# Patient Record
Sex: Female | Born: 1975 | Race: White | Hispanic: No | Marital: Married | Smoking: Former smoker
Health system: Southern US, Community
[De-identification: ages and names within clinical notes are randomized; demographics above are authoritative.]

## PROBLEM LIST (undated history)

## (undated) DIAGNOSIS — C801 Malignant (primary) neoplasm, unspecified: Secondary | ICD-10-CM

## (undated) DIAGNOSIS — C50919 Malignant neoplasm of unspecified site of unspecified female breast: Secondary | ICD-10-CM

## (undated) DIAGNOSIS — K589 Irritable bowel syndrome without diarrhea: Secondary | ICD-10-CM

## (undated) DIAGNOSIS — E079 Disorder of thyroid, unspecified: Secondary | ICD-10-CM

## (undated) DIAGNOSIS — F419 Anxiety disorder, unspecified: Secondary | ICD-10-CM

## (undated) HISTORY — PX: BREAST RECONSTRUCTION: SHX9

## (undated) HISTORY — DX: Anxiety disorder, unspecified: F41.9

## (undated) HISTORY — DX: Irritable bowel syndrome, unspecified: K58.9

## (undated) HISTORY — PX: CHOLECYSTECTOMY: SHX55

## (undated) HISTORY — DX: Malignant neoplasm of unspecified site of unspecified female breast: C50.919

## (undated) HISTORY — PX: TONSILLECTOMY: SUR1361

## (undated) HISTORY — DX: Disorder of thyroid, unspecified: E07.9

## (undated) HISTORY — DX: Malignant (primary) neoplasm, unspecified: C80.1

---

## 1998-04-13 ENCOUNTER — Other Ambulatory Visit: Admission: RE | Admit: 1998-04-13 | Discharge: 1998-04-13 | Payer: Self-pay | Admitting: Obstetrics and Gynecology

## 2006-01-09 ENCOUNTER — Other Ambulatory Visit: Admission: RE | Admit: 2006-01-09 | Discharge: 2006-01-09 | Payer: Self-pay | Admitting: Obstetrics and Gynecology

## 2006-04-30 ENCOUNTER — Ambulatory Visit (HOSPITAL_COMMUNITY): Admission: EM | Admit: 2006-04-30 | Discharge: 2006-05-01 | Payer: Self-pay | Admitting: Emergency Medicine

## 2006-04-30 ENCOUNTER — Encounter (INDEPENDENT_AMBULATORY_CARE_PROVIDER_SITE_OTHER): Payer: Self-pay | Admitting: Specialist

## 2006-11-05 ENCOUNTER — Encounter: Admission: RE | Admit: 2006-11-05 | Discharge: 2006-11-05 | Payer: Self-pay | Admitting: Gastroenterology

## 2007-02-28 ENCOUNTER — Encounter: Admission: RE | Admit: 2007-02-28 | Discharge: 2007-02-28 | Payer: Self-pay | Admitting: Obstetrics and Gynecology

## 2007-02-28 ENCOUNTER — Encounter (INDEPENDENT_AMBULATORY_CARE_PROVIDER_SITE_OTHER): Payer: Self-pay | Admitting: Diagnostic Radiology

## 2007-02-28 DIAGNOSIS — C50919 Malignant neoplasm of unspecified site of unspecified female breast: Secondary | ICD-10-CM

## 2007-02-28 HISTORY — DX: Malignant neoplasm of unspecified site of unspecified female breast: C50.919

## 2007-03-06 ENCOUNTER — Ambulatory Visit: Payer: Self-pay | Admitting: Oncology

## 2007-03-12 ENCOUNTER — Encounter: Admission: RE | Admit: 2007-03-12 | Discharge: 2007-03-12 | Payer: Self-pay | Admitting: Obstetrics and Gynecology

## 2007-03-19 LAB — COMPREHENSIVE METABOLIC PANEL
ALT: 12 U/L (ref 0–35)
AST: 13 U/L (ref 0–37)
Albumin: 4.6 g/dL (ref 3.5–5.2)
Alkaline Phosphatase: 58 U/L (ref 39–117)
BUN: 9 mg/dL (ref 6–23)
CO2: 25 mEq/L (ref 19–32)
Calcium: 9.6 mg/dL (ref 8.4–10.5)
Chloride: 105 mEq/L (ref 96–112)
Creatinine, Ser: 0.83 mg/dL (ref 0.40–1.20)
Glucose, Bld: 89 mg/dL (ref 70–99)
Potassium: 4.3 mEq/L (ref 3.5–5.3)
Sodium: 141 mEq/L (ref 135–145)
Total Bilirubin: 0.6 mg/dL (ref 0.3–1.2)
Total Protein: 7.5 g/dL (ref 6.0–8.3)

## 2007-03-19 LAB — CBC WITH DIFFERENTIAL/PLATELET
BASO%: 0.8 % (ref 0.0–2.0)
Basophils Absolute: 0 10*3/uL (ref 0.0–0.1)
EOS%: 1.2 % (ref 0.0–7.0)
Eosinophils Absolute: 0.1 10*3/uL (ref 0.0–0.5)
HCT: 41 % (ref 34.8–46.6)
HGB: 14.4 g/dL (ref 11.6–15.9)
LYMPH%: 29.6 % (ref 14.0–48.0)
MCH: 31.9 pg (ref 26.0–34.0)
MCHC: 35 g/dL (ref 32.0–36.0)
MCV: 91 fL (ref 81.0–101.0)
MONO#: 0.6 10*3/uL (ref 0.1–0.9)
MONO%: 12.8 % (ref 0.0–13.0)
NEUT#: 2.5 10*3/uL (ref 1.5–6.5)
NEUT%: 55.6 % (ref 39.6–76.8)
Platelets: 299 10*3/uL (ref 145–400)
RBC: 4.51 10*6/uL (ref 3.70–5.32)
RDW: 11.9 % (ref 11.3–14.5)
WBC: 4.5 10*3/uL (ref 3.9–10.0)
lymph#: 1.3 10*3/uL (ref 0.9–3.3)

## 2007-03-19 LAB — CANCER ANTIGEN 27.29: CA 27.29: 21 U/mL (ref 0–39)

## 2007-03-19 LAB — LACTATE DEHYDROGENASE: LDH: 138 U/L (ref 94–250)

## 2007-03-28 ENCOUNTER — Encounter (HOSPITAL_COMMUNITY): Admission: RE | Admit: 2007-03-28 | Discharge: 2007-06-09 | Payer: Self-pay | Admitting: Oncology

## 2007-03-31 ENCOUNTER — Ambulatory Visit (HOSPITAL_COMMUNITY): Admission: RE | Admit: 2007-03-31 | Discharge: 2007-03-31 | Payer: Self-pay | Admitting: Oncology

## 2007-04-08 LAB — CBC WITH DIFFERENTIAL/PLATELET
BASO%: 3.3 % — ABNORMAL HIGH (ref 0.0–2.0)
Basophils Absolute: 0 10*3/uL (ref 0.0–0.1)
EOS%: 4.4 % (ref 0.0–7.0)
Eosinophils Absolute: 0.1 10*3/uL (ref 0.0–0.5)
HCT: 37.4 % (ref 34.8–46.6)
HGB: 13.3 g/dL (ref 11.6–15.9)
LYMPH%: 63.6 % — ABNORMAL HIGH (ref 14.0–48.0)
MCH: 31.8 pg (ref 26.0–34.0)
MCHC: 35.6 g/dL (ref 32.0–36.0)
MCV: 89.2 fL (ref 81.0–101.0)
MONO#: 0.1 10*3/uL (ref 0.1–0.9)
MONO%: 4.2 % (ref 0.0–13.0)
NEUT#: 0.3 10*3/uL — CL (ref 1.5–6.5)
NEUT%: 24.5 % — ABNORMAL LOW (ref 39.6–76.8)
Platelets: 119 10*3/uL — ABNORMAL LOW (ref 145–400)
RBC: 4.19 10*6/uL (ref 3.70–5.32)
RDW: 12.2 % (ref 11.3–14.5)
WBC: 1.3 10*3/uL — ABNORMAL LOW (ref 3.9–10.0)
lymph#: 0.8 10*3/uL — ABNORMAL LOW (ref 0.9–3.3)

## 2007-04-15 ENCOUNTER — Encounter: Admission: RE | Admit: 2007-04-15 | Discharge: 2007-04-15 | Payer: Self-pay | Admitting: Interventional Radiology

## 2007-04-15 LAB — CBC WITH DIFFERENTIAL/PLATELET
BASO%: 0.3 % (ref 0.0–2.0)
Basophils Absolute: 0 10*3/uL (ref 0.0–0.1)
EOS%: 0.2 % (ref 0.0–7.0)
Eosinophils Absolute: 0 10*3/uL (ref 0.0–0.5)
HCT: 35.8 % (ref 34.8–46.6)
HGB: 12.9 g/dL (ref 11.6–15.9)
LYMPH%: 12 % — ABNORMAL LOW (ref 14.0–48.0)
MCH: 32.3 pg (ref 26.0–34.0)
MCHC: 36 g/dL (ref 32.0–36.0)
MCV: 89.7 fL (ref 81.0–101.0)
MONO#: 1.1 10*3/uL — ABNORMAL HIGH (ref 0.1–0.9)
MONO%: 11 % (ref 0.0–13.0)
NEUT#: 7.9 10*3/uL — ABNORMAL HIGH (ref 1.5–6.5)
NEUT%: 76.5 % (ref 39.6–76.8)
Platelets: 373 10*3/uL (ref 145–400)
RBC: 3.98 10*6/uL (ref 3.70–5.32)
RDW: 12.1 % (ref 11.3–14.5)
WBC: 10.3 10*3/uL — ABNORMAL HIGH (ref 3.9–10.0)
lymph#: 1.2 10*3/uL (ref 0.9–3.3)

## 2007-04-15 LAB — COMPREHENSIVE METABOLIC PANEL
ALT: 20 U/L (ref 0–35)
AST: 20 U/L (ref 0–37)
Albumin: 4.3 g/dL (ref 3.5–5.2)
Alkaline Phosphatase: 78 U/L (ref 39–117)
BUN: 9 mg/dL (ref 6–23)
CO2: 22 mEq/L (ref 19–32)
Calcium: 8.9 mg/dL (ref 8.4–10.5)
Chloride: 107 mEq/L (ref 96–112)
Creatinine, Ser: 0.8 mg/dL (ref 0.40–1.20)
Glucose, Bld: 106 mg/dL — ABNORMAL HIGH (ref 70–99)
Potassium: 4.1 mEq/L (ref 3.5–5.3)
Sodium: 141 mEq/L (ref 135–145)
Total Bilirubin: 0.3 mg/dL (ref 0.3–1.2)
Total Protein: 6.7 g/dL (ref 6.0–8.3)

## 2007-04-17 ENCOUNTER — Ambulatory Visit: Payer: Self-pay | Admitting: Oncology

## 2007-04-23 LAB — CBC WITH DIFFERENTIAL/PLATELET
BASO%: 2.1 % — ABNORMAL HIGH (ref 0.0–2.0)
Basophils Absolute: 0.1 10*3/uL (ref 0.0–0.1)
EOS%: 0.1 % (ref 0.0–7.0)
Eosinophils Absolute: 0 10*3/uL (ref 0.0–0.5)
HCT: 32.9 % — ABNORMAL LOW (ref 34.8–46.6)
HGB: 11.9 g/dL (ref 11.6–15.9)
LYMPH%: 18.8 % (ref 14.0–48.0)
MCH: 31.9 pg (ref 26.0–34.0)
MCHC: 36.3 g/dL — ABNORMAL HIGH (ref 32.0–36.0)
MCV: 87.9 fL (ref 81.0–101.0)
MONO#: 0.4 10*3/uL (ref 0.1–0.9)
MONO%: 6.3 % (ref 0.0–13.0)
NEUT#: 4.3 10*3/uL (ref 1.5–6.5)
NEUT%: 72.8 % (ref 39.6–76.8)
Platelets: 201 10*3/uL (ref 145–400)
RBC: 3.74 10*6/uL (ref 3.70–5.32)
RDW: 10.4 % — ABNORMAL LOW (ref 11.3–14.5)
WBC: 5.9 10*3/uL (ref 3.9–10.0)
lymph#: 1.1 10*3/uL (ref 0.9–3.3)

## 2007-04-23 LAB — PROTIME-INR
INR: 1 — ABNORMAL LOW (ref 2.00–3.50)
Protime: 12 Seconds (ref 10.6–13.4)

## 2007-05-08 ENCOUNTER — Encounter: Admission: RE | Admit: 2007-05-08 | Discharge: 2007-05-08 | Payer: Self-pay | Admitting: Interventional Radiology

## 2007-05-09 LAB — CBC WITH DIFFERENTIAL/PLATELET
BASO%: 1.4 % (ref 0.0–2.0)
Basophils Absolute: 0.1 10*3/uL (ref 0.0–0.1)
EOS%: 0.6 % (ref 0.0–7.0)
Eosinophils Absolute: 0 10*3/uL (ref 0.0–0.5)
HCT: 30.7 % — ABNORMAL LOW (ref 34.8–46.6)
HGB: 10.9 g/dL — ABNORMAL LOW (ref 11.6–15.9)
LYMPH%: 18 % (ref 14.0–48.0)
MCH: 32.2 pg (ref 26.0–34.0)
MCHC: 35.5 g/dL (ref 32.0–36.0)
MCV: 90.7 fL (ref 81.0–101.0)
MONO#: 0.5 10*3/uL (ref 0.1–0.9)
MONO%: 8.6 % (ref 0.0–13.0)
NEUT#: 3.8 10*3/uL (ref 1.5–6.5)
NEUT%: 71.4 % (ref 39.6–76.8)
Platelets: 172 10*3/uL (ref 145–400)
RBC: 3.39 10*6/uL — ABNORMAL LOW (ref 3.70–5.32)
RDW: 14.4 % (ref 11.3–14.5)
WBC: 5.3 10*3/uL (ref 3.9–10.0)
lymph#: 1 10*3/uL (ref 0.9–3.3)

## 2007-05-22 LAB — CBC WITH DIFFERENTIAL/PLATELET
BASO%: 1.8 % (ref 0.0–2.0)
Basophils Absolute: 0.1 10*3/uL (ref 0.0–0.1)
EOS%: 0.4 % (ref 0.0–7.0)
Eosinophils Absolute: 0 10*3/uL (ref 0.0–0.5)
HCT: 33.2 % — ABNORMAL LOW (ref 34.8–46.6)
HGB: 11.9 g/dL (ref 11.6–15.9)
LYMPH%: 18.6 % (ref 14.0–48.0)
MCH: 33.1 pg (ref 26.0–34.0)
MCHC: 35.8 g/dL (ref 32.0–36.0)
MCV: 92.4 fL (ref 81.0–101.0)
MONO#: 1 10*3/uL — ABNORMAL HIGH (ref 0.1–0.9)
MONO%: 20.8 % — ABNORMAL HIGH (ref 0.0–13.0)
NEUT#: 2.8 10*3/uL (ref 1.5–6.5)
NEUT%: 58.4 % (ref 39.6–76.8)
Platelets: 257 10*3/uL (ref 145–400)
RBC: 3.59 10*6/uL — ABNORMAL LOW (ref 3.70–5.32)
RDW: 14.6 % — ABNORMAL HIGH (ref 11.3–14.5)
WBC: 4.8 10*3/uL (ref 3.9–10.0)
lymph#: 0.9 10*3/uL (ref 0.9–3.3)

## 2007-05-22 LAB — COMPREHENSIVE METABOLIC PANEL
ALT: 33 U/L (ref 0–35)
AST: 28 U/L (ref 0–37)
Albumin: 4.5 g/dL (ref 3.5–5.2)
Alkaline Phosphatase: 67 U/L (ref 39–117)
BUN: 8 mg/dL (ref 6–23)
CO2: 21 mEq/L (ref 19–32)
Calcium: 8.9 mg/dL (ref 8.4–10.5)
Chloride: 105 mEq/L (ref 96–112)
Creatinine, Ser: 0.61 mg/dL (ref 0.40–1.20)
Glucose, Bld: 94 mg/dL (ref 70–99)
Potassium: 3.9 mEq/L (ref 3.5–5.3)
Sodium: 139 mEq/L (ref 135–145)
Total Bilirubin: 0.7 mg/dL (ref 0.3–1.2)
Total Protein: 6.6 g/dL (ref 6.0–8.3)

## 2007-05-27 ENCOUNTER — Encounter: Admission: RE | Admit: 2007-05-27 | Discharge: 2007-05-27 | Payer: Self-pay | Admitting: Oncology

## 2007-05-29 LAB — CBC WITH DIFFERENTIAL/PLATELET
BASO%: 0.8 % (ref 0.0–2.0)
Basophils Absolute: 0 10*3/uL (ref 0.0–0.1)
EOS%: 0.8 % (ref 0.0–7.0)
Eosinophils Absolute: 0 10*3/uL (ref 0.0–0.5)
HCT: 30.2 % — ABNORMAL LOW (ref 34.8–46.6)
HGB: 10.8 g/dL — ABNORMAL LOW (ref 11.6–15.9)
LYMPH%: 18.7 % (ref 14.0–48.0)
MCH: 33.8 pg (ref 26.0–34.0)
MCHC: 35.9 g/dL (ref 32.0–36.0)
MCV: 94 fL (ref 81.0–101.0)
MONO#: 0.2 10*3/uL (ref 0.1–0.9)
MONO%: 4.6 % (ref 0.0–13.0)
NEUT#: 2.8 10*3/uL (ref 1.5–6.5)
NEUT%: 75.1 % (ref 39.6–76.8)
Platelets: 140 10*3/uL — ABNORMAL LOW (ref 145–400)
RBC: 3.21 10*6/uL — ABNORMAL LOW (ref 3.70–5.32)
RDW: 17.3 % — ABNORMAL HIGH (ref 11.3–14.5)
WBC: 3.8 10*3/uL — ABNORMAL LOW (ref 3.9–10.0)
lymph#: 0.7 10*3/uL — ABNORMAL LOW (ref 0.9–3.3)

## 2007-05-29 LAB — URINALYSIS, MICROSCOPIC - CHCC
Bilirubin (Urine): NEGATIVE
Blood: NEGATIVE
Glucose: NEGATIVE g/dL
Ketones: NEGATIVE mg/dL
Leukocyte Esterase: NEGATIVE
Nitrite: NEGATIVE
Protein: NEGATIVE mg/dL
Specific Gravity, Urine: 1.025 (ref 1.003–1.035)
pH: 6 (ref 4.6–8.0)

## 2007-05-30 ENCOUNTER — Ambulatory Visit: Payer: Self-pay | Admitting: Oncology

## 2007-05-31 LAB — URINE CULTURE

## 2007-06-04 LAB — COMPREHENSIVE METABOLIC PANEL
ALT: 18 U/L (ref 0–35)
AST: 18 U/L (ref 0–37)
Albumin: 4.3 g/dL (ref 3.5–5.2)
Alkaline Phosphatase: 68 U/L (ref 39–117)
BUN: 9 mg/dL (ref 6–23)
CO2: 24 mEq/L (ref 19–32)
Calcium: 8.9 mg/dL (ref 8.4–10.5)
Chloride: 104 mEq/L (ref 96–112)
Creatinine, Ser: 0.69 mg/dL (ref 0.40–1.20)
Glucose, Bld: 88 mg/dL (ref 70–99)
Potassium: 3.8 mEq/L (ref 3.5–5.3)
Sodium: 138 mEq/L (ref 135–145)
Total Bilirubin: 0.5 mg/dL (ref 0.3–1.2)
Total Protein: 6.7 g/dL (ref 6.0–8.3)

## 2007-06-04 LAB — CBC WITH DIFFERENTIAL/PLATELET
BASO%: 0.5 % (ref 0.0–2.0)
Basophils Absolute: 0 10*3/uL (ref 0.0–0.1)
EOS%: 0.6 % (ref 0.0–7.0)
Eosinophils Absolute: 0 10*3/uL (ref 0.0–0.5)
HCT: 31 % — ABNORMAL LOW (ref 34.8–46.6)
HGB: 11.1 g/dL — ABNORMAL LOW (ref 11.6–15.9)
LYMPH%: 16.3 % (ref 14.0–48.0)
MCH: 34.5 pg — ABNORMAL HIGH (ref 26.0–34.0)
MCHC: 35.9 g/dL (ref 32.0–36.0)
MCV: 96 fL (ref 81.0–101.0)
MONO#: 1 10*3/uL — ABNORMAL HIGH (ref 0.1–0.9)
MONO%: 19 % — ABNORMAL HIGH (ref 0.0–13.0)
NEUT#: 3.5 10*3/uL (ref 1.5–6.5)
NEUT%: 63.6 % (ref 39.6–76.8)
Platelets: 223 10*3/uL (ref 145–400)
RBC: 3.23 10*6/uL — ABNORMAL LOW (ref 3.70–5.32)
RDW: 18.7 % — ABNORMAL HIGH (ref 11.3–14.5)
WBC: 5.5 10*3/uL (ref 3.9–10.0)
lymph#: 0.9 10*3/uL (ref 0.9–3.3)

## 2007-06-12 LAB — CBC WITH DIFFERENTIAL/PLATELET
BASO%: 0.2 % (ref 0.0–2.0)
Basophils Absolute: 0 10*3/uL (ref 0.0–0.1)
EOS%: 0 % (ref 0.0–7.0)
Eosinophils Absolute: 0 10*3/uL (ref 0.0–0.5)
HCT: 31.8 % — ABNORMAL LOW (ref 34.8–46.6)
HGB: 11.3 g/dL — ABNORMAL LOW (ref 11.6–15.9)
LYMPH%: 6.6 % — ABNORMAL LOW (ref 14.0–48.0)
MCH: 34.3 pg — ABNORMAL HIGH (ref 26.0–34.0)
MCHC: 35.5 g/dL (ref 32.0–36.0)
MCV: 96.6 fL (ref 81.0–101.0)
MONO#: 2.2 10*3/uL — ABNORMAL HIGH (ref 0.1–0.9)
MONO%: 10.4 % (ref 0.0–13.0)
NEUT#: 17.6 10*3/uL — ABNORMAL HIGH (ref 1.5–6.5)
NEUT%: 82.8 % — ABNORMAL HIGH (ref 39.6–76.8)
Platelets: 325 10*3/uL (ref 145–400)
RBC: 3.29 10*6/uL — ABNORMAL LOW (ref 3.70–5.32)
RDW: 17.3 % — ABNORMAL HIGH (ref 11.3–14.5)
WBC: 21.3 10*3/uL — ABNORMAL HIGH (ref 3.9–10.0)
lymph#: 1.4 10*3/uL (ref 0.9–3.3)

## 2007-06-12 LAB — COMPREHENSIVE METABOLIC PANEL
ALT: 48 U/L — ABNORMAL HIGH (ref 0–35)
AST: 45 U/L — ABNORMAL HIGH (ref 0–37)
Albumin: 4.3 g/dL (ref 3.5–5.2)
Alkaline Phosphatase: 106 U/L (ref 39–117)
BUN: 7 mg/dL (ref 6–23)
CO2: 25 mEq/L (ref 19–32)
Calcium: 9.2 mg/dL (ref 8.4–10.5)
Chloride: 103 mEq/L (ref 96–112)
Creatinine, Ser: 0.75 mg/dL (ref 0.40–1.20)
Glucose, Bld: 99 mg/dL (ref 70–99)
Potassium: 4.1 mEq/L (ref 3.5–5.3)
Sodium: 140 mEq/L (ref 135–145)
Total Bilirubin: 0.5 mg/dL (ref 0.3–1.2)
Total Protein: 6.6 g/dL (ref 6.0–8.3)

## 2007-06-18 LAB — CBC WITH DIFFERENTIAL/PLATELET
BASO%: 0 % (ref 0.0–2.0)
Basophils Absolute: 0 10*3/uL (ref 0.0–0.1)
EOS%: 0 % (ref 0.0–7.0)
Eosinophils Absolute: 0 10*3/uL (ref 0.0–0.5)
HCT: 35 % (ref 34.8–46.6)
HGB: 12.6 g/dL (ref 11.6–15.9)
LYMPH%: 3.6 % — ABNORMAL LOW (ref 14.0–48.0)
MCH: 34.5 pg — ABNORMAL HIGH (ref 26.0–34.0)
MCHC: 36 g/dL (ref 32.0–36.0)
MCV: 96 fL (ref 81.0–101.0)
MONO#: 0.3 10*3/uL (ref 0.1–0.9)
MONO%: 1.5 % (ref 0.0–13.0)
NEUT#: 20.4 10*3/uL — ABNORMAL HIGH (ref 1.5–6.5)
NEUT%: 94.9 % — ABNORMAL HIGH (ref 39.6–76.8)
Platelets: 294 10*3/uL (ref 145–400)
RBC: 3.65 10*6/uL — ABNORMAL LOW (ref 3.70–5.32)
RDW: 16.7 % — ABNORMAL HIGH (ref 11.3–14.5)
WBC: 21.5 10*3/uL — ABNORMAL HIGH (ref 3.9–10.0)
lymph#: 0.8 10*3/uL — ABNORMAL LOW (ref 0.9–3.3)

## 2007-06-18 LAB — COMPREHENSIVE METABOLIC PANEL
ALT: 22 U/L (ref 0–35)
AST: 21 U/L (ref 0–37)
Albumin: 4.8 g/dL (ref 3.5–5.2)
Alkaline Phosphatase: 89 U/L (ref 39–117)
BUN: 8 mg/dL (ref 6–23)
CO2: 22 mEq/L (ref 19–32)
Calcium: 9.5 mg/dL (ref 8.4–10.5)
Chloride: 107 mEq/L (ref 96–112)
Creatinine, Ser: 0.63 mg/dL (ref 0.40–1.20)
Glucose, Bld: 130 mg/dL — ABNORMAL HIGH (ref 70–99)
Potassium: 4.1 mEq/L (ref 3.5–5.3)
Sodium: 142 mEq/L (ref 135–145)
Total Bilirubin: 0.5 mg/dL (ref 0.3–1.2)
Total Protein: 7.3 g/dL (ref 6.0–8.3)

## 2007-06-26 LAB — CBC WITH DIFFERENTIAL/PLATELET
BASO%: 0.2 % (ref 0.0–2.0)
Basophils Absolute: 0 10*3/uL (ref 0.0–0.1)
EOS%: 0.1 % (ref 0.0–7.0)
Eosinophils Absolute: 0 10*3/uL (ref 0.0–0.5)
HCT: 31.8 % — ABNORMAL LOW (ref 34.8–46.6)
HGB: 11.3 g/dL — ABNORMAL LOW (ref 11.6–15.9)
LYMPH%: 6.8 % — ABNORMAL LOW (ref 14.0–48.0)
MCH: 34.2 pg — ABNORMAL HIGH (ref 26.0–34.0)
MCHC: 35.5 g/dL (ref 32.0–36.0)
MCV: 96.2 fL (ref 81.0–101.0)
MONO#: 2 10*3/uL — ABNORMAL HIGH (ref 0.1–0.9)
MONO%: 10.6 % (ref 0.0–13.0)
NEUT#: 15.3 10*3/uL — ABNORMAL HIGH (ref 1.5–6.5)
NEUT%: 82.3 % — ABNORMAL HIGH (ref 39.6–76.8)
Platelets: 271 10*3/uL (ref 145–400)
RBC: 3.3 10*6/uL — ABNORMAL LOW (ref 3.70–5.32)
RDW: 16.1 % — ABNORMAL HIGH (ref 11.3–14.5)
WBC: 18.6 10*3/uL — ABNORMAL HIGH (ref 3.9–10.0)
lymph#: 1.3 10*3/uL (ref 0.9–3.3)

## 2007-07-03 LAB — CBC WITH DIFFERENTIAL/PLATELET
BASO%: 0.2 % (ref 0.0–2.0)
Basophils Absolute: 0 10*3/uL (ref 0.0–0.1)
EOS%: 0 % (ref 0.0–7.0)
Eosinophils Absolute: 0 10*3/uL (ref 0.0–0.5)
HCT: 35.8 % (ref 34.8–46.6)
HGB: 12.9 g/dL (ref 11.6–15.9)
LYMPH%: 4.8 % — ABNORMAL LOW (ref 14.0–48.0)
MCH: 34 pg (ref 26.0–34.0)
MCHC: 36 g/dL (ref 32.0–36.0)
MCV: 94.3 fL (ref 81.0–101.0)
MONO#: 0.3 10*3/uL (ref 0.1–0.9)
MONO%: 1.9 % (ref 0.0–13.0)
NEUT#: 13.1 10*3/uL — ABNORMAL HIGH (ref 1.5–6.5)
NEUT%: 93.1 % — ABNORMAL HIGH (ref 39.6–76.8)
Platelets: 250 10*3/uL (ref 145–400)
RBC: 3.8 10*6/uL (ref 3.70–5.32)
RDW: 12 % (ref 11.3–14.5)
WBC: 14.1 10*3/uL — ABNORMAL HIGH (ref 3.9–10.0)
lymph#: 0.7 10*3/uL — ABNORMAL LOW (ref 0.9–3.3)

## 2007-07-03 LAB — COMPREHENSIVE METABOLIC PANEL
ALT: 15 U/L (ref 0–35)
AST: 17 U/L (ref 0–37)
Albumin: 4.2 g/dL (ref 3.5–5.2)
Alkaline Phosphatase: 83 U/L (ref 39–117)
BUN: 8 mg/dL (ref 6–23)
CO2: 20 mEq/L (ref 19–32)
Calcium: 9 mg/dL (ref 8.4–10.5)
Chloride: 107 mEq/L (ref 96–112)
Creatinine, Ser: 0.58 mg/dL (ref 0.40–1.20)
Glucose, Bld: 175 mg/dL — ABNORMAL HIGH (ref 70–99)
Potassium: 3.5 mEq/L (ref 3.5–5.3)
Sodium: 139 mEq/L (ref 135–145)
Total Bilirubin: 0.7 mg/dL (ref 0.3–1.2)
Total Protein: 6.5 g/dL (ref 6.0–8.3)

## 2007-07-10 LAB — CBC WITH DIFFERENTIAL/PLATELET
BASO%: 0.4 % (ref 0.0–2.0)
Basophils Absolute: 0.1 10*3/uL (ref 0.0–0.1)
EOS%: 0.1 % (ref 0.0–7.0)
Eosinophils Absolute: 0 10*3/uL (ref 0.0–0.5)
HCT: 32.1 % — ABNORMAL LOW (ref 34.8–46.6)
HGB: 11.4 g/dL — ABNORMAL LOW (ref 11.6–15.9)
LYMPH%: 8.6 % — ABNORMAL LOW (ref 14.0–48.0)
MCH: 34.4 pg — ABNORMAL HIGH (ref 26.0–34.0)
MCHC: 35.5 g/dL (ref 32.0–36.0)
MCV: 96.9 fL (ref 81.0–101.0)
MONO#: 1.5 10*3/uL — ABNORMAL HIGH (ref 0.1–0.9)
MONO%: 11 % (ref 0.0–13.0)
NEUT#: 11.2 10*3/uL — ABNORMAL HIGH (ref 1.5–6.5)
NEUT%: 79.9 % — ABNORMAL HIGH (ref 39.6–76.8)
Platelets: 304 10*3/uL (ref 145–400)
RBC: 3.31 10*6/uL — ABNORMAL LOW (ref 3.70–5.32)
RDW: 14.5 % (ref 11.3–14.5)
WBC: 14.1 10*3/uL — ABNORMAL HIGH (ref 3.9–10.0)
lymph#: 1.2 10*3/uL (ref 0.9–3.3)

## 2007-07-15 ENCOUNTER — Ambulatory Visit: Payer: Self-pay | Admitting: Oncology

## 2007-07-17 ENCOUNTER — Ambulatory Visit (HOSPITAL_COMMUNITY): Admission: RE | Admit: 2007-07-17 | Discharge: 2007-07-17 | Payer: Self-pay | Admitting: Oncology

## 2007-07-17 LAB — CBC WITH DIFFERENTIAL/PLATELET
BASO%: 0.7 % (ref 0.0–2.0)
Basophils Absolute: 0.1 10*3/uL (ref 0.0–0.1)
EOS%: 0.5 % (ref 0.0–7.0)
Eosinophils Absolute: 0.1 10*3/uL (ref 0.0–0.5)
HCT: 33.3 % — ABNORMAL LOW (ref 34.8–46.6)
HGB: 11.7 g/dL (ref 11.6–15.9)
LYMPH%: 11.3 % — ABNORMAL LOW (ref 14.0–48.0)
MCH: 33.7 pg (ref 26.0–34.0)
MCHC: 35.2 g/dL (ref 32.0–36.0)
MCV: 95.6 fL (ref 81.0–101.0)
MONO#: 1.2 10*3/uL — ABNORMAL HIGH (ref 0.1–0.9)
MONO%: 11.7 % (ref 0.0–13.0)
NEUT#: 7.6 10*3/uL — ABNORMAL HIGH (ref 1.5–6.5)
NEUT%: 75.9 % (ref 39.6–76.8)
Platelets: 208 10*3/uL (ref 145–400)
RBC: 3.48 10*6/uL — ABNORMAL LOW (ref 3.70–5.32)
RDW: 12 % (ref 11.3–14.5)
WBC: 10 10*3/uL (ref 3.9–10.0)
lymph#: 1.1 10*3/uL (ref 0.9–3.3)

## 2007-07-22 ENCOUNTER — Ambulatory Visit (HOSPITAL_COMMUNITY): Admission: RE | Admit: 2007-07-22 | Discharge: 2007-07-22 | Payer: Self-pay | Admitting: Oncology

## 2007-07-28 LAB — T4: T4, Total: 8.1 ug/dL (ref 5.0–12.5)

## 2007-07-28 LAB — T3 UPTAKE: T3 Uptake: 31 % (ref 22.5–37.0)

## 2007-07-28 LAB — T3: T3, Total: 157.2 ng/dL (ref 60.0–181.0)

## 2007-07-28 LAB — T4, FREE: Free T4: 0.89 ng/dL (ref 0.89–1.80)

## 2007-07-28 LAB — TSH: TSH: 2.245 u[IU]/mL (ref 0.350–5.500)

## 2007-08-01 ENCOUNTER — Encounter: Admission: RE | Admit: 2007-08-01 | Discharge: 2007-08-01 | Payer: Self-pay | Admitting: Endocrinology

## 2007-08-14 HISTORY — PX: PORT-A-CATH REMOVAL: SHX5289

## 2007-08-14 HISTORY — PX: MASTECTOMY: SHX3

## 2007-09-11 ENCOUNTER — Other Ambulatory Visit: Admission: RE | Admit: 2007-09-11 | Discharge: 2007-09-11 | Payer: Self-pay | Admitting: Interventional Radiology

## 2007-09-11 ENCOUNTER — Encounter: Admission: RE | Admit: 2007-09-11 | Discharge: 2007-09-11 | Payer: Self-pay | Admitting: Endocrinology

## 2007-09-11 ENCOUNTER — Encounter (INDEPENDENT_AMBULATORY_CARE_PROVIDER_SITE_OTHER): Payer: Self-pay | Admitting: Interventional Radiology

## 2007-09-26 ENCOUNTER — Ambulatory Visit: Payer: Self-pay | Admitting: Oncology

## 2007-09-30 LAB — LACTATE DEHYDROGENASE: LDH: 147 U/L (ref 94–250)

## 2007-09-30 LAB — COMPREHENSIVE METABOLIC PANEL
ALT: 10 U/L (ref 0–35)
AST: 16 U/L (ref 0–37)
Albumin: 4.7 g/dL (ref 3.5–5.2)
Alkaline Phosphatase: 81 U/L (ref 39–117)
BUN: 10 mg/dL (ref 6–23)
CO2: 23 mEq/L (ref 19–32)
Calcium: 9.3 mg/dL (ref 8.4–10.5)
Chloride: 103 mEq/L (ref 96–112)
Creatinine, Ser: 0.7 mg/dL (ref 0.40–1.20)
Glucose, Bld: 95 mg/dL (ref 70–99)
Potassium: 4.1 mEq/L (ref 3.5–5.3)
Sodium: 141 mEq/L (ref 135–145)
Total Bilirubin: 0.5 mg/dL (ref 0.3–1.2)
Total Protein: 7.5 g/dL (ref 6.0–8.3)

## 2007-09-30 LAB — CBC WITH DIFFERENTIAL/PLATELET
BASO%: 0.7 % (ref 0.0–2.0)
Basophils Absolute: 0 10*3/uL (ref 0.0–0.1)
EOS%: 6.1 % (ref 0.0–7.0)
Eosinophils Absolute: 0.2 10*3/uL (ref 0.0–0.5)
HCT: 37.4 % (ref 34.8–46.6)
HGB: 12.8 g/dL (ref 11.6–15.9)
LYMPH%: 21.9 % (ref 14.0–48.0)
MCH: 30 pg (ref 26.0–34.0)
MCHC: 34.2 g/dL (ref 32.0–36.0)
MCV: 87.5 fL (ref 81.0–101.0)
MONO#: 0.7 10*3/uL (ref 0.1–0.9)
MONO%: 16.7 % — ABNORMAL HIGH (ref 0.0–13.0)
NEUT#: 2.2 10*3/uL (ref 1.5–6.5)
NEUT%: 54.6 % (ref 39.6–76.8)
Platelets: 328 10*3/uL (ref 145–400)
RBC: 4.27 10*6/uL (ref 3.70–5.32)
RDW: 12.9 % (ref 11.3–14.5)
WBC: 4 10*3/uL (ref 3.9–10.0)
lymph#: 0.9 10*3/uL (ref 0.9–3.3)

## 2007-09-30 LAB — CANCER ANTIGEN 27.29: CA 27.29: 20 U/mL (ref 0–39)

## 2007-12-25 ENCOUNTER — Ambulatory Visit: Payer: Self-pay | Admitting: Oncology

## 2007-12-25 ENCOUNTER — Encounter: Admission: RE | Admit: 2007-12-25 | Discharge: 2007-12-25 | Payer: Self-pay | Admitting: Endocrinology

## 2007-12-29 LAB — COMPREHENSIVE METABOLIC PANEL
ALT: 10 U/L (ref 0–35)
AST: 13 U/L (ref 0–37)
Albumin: 4.9 g/dL (ref 3.5–5.2)
Alkaline Phosphatase: 74 U/L (ref 39–117)
BUN: 13 mg/dL (ref 6–23)
CO2: 21 mEq/L (ref 19–32)
Calcium: 9 mg/dL (ref 8.4–10.5)
Chloride: 104 mEq/L (ref 96–112)
Creatinine, Ser: 0.74 mg/dL (ref 0.40–1.20)
Glucose, Bld: 86 mg/dL (ref 70–99)
Potassium: 3.7 mEq/L (ref 3.5–5.3)
Sodium: 138 mEq/L (ref 135–145)
Total Bilirubin: 0.6 mg/dL (ref 0.3–1.2)
Total Protein: 7.2 g/dL (ref 6.0–8.3)

## 2007-12-29 LAB — CBC WITH DIFFERENTIAL/PLATELET
BASO%: 0.6 % (ref 0.0–2.0)
Basophils Absolute: 0 10*3/uL (ref 0.0–0.1)
EOS%: 1.9 % (ref 0.0–7.0)
Eosinophils Absolute: 0.1 10*3/uL (ref 0.0–0.5)
HCT: 36.2 % (ref 34.8–46.6)
HGB: 12.7 g/dL (ref 11.6–15.9)
LYMPH%: 24.7 % (ref 14.0–48.0)
MCH: 31.1 pg (ref 26.0–34.0)
MCHC: 35.1 g/dL (ref 32.0–36.0)
MCV: 88.6 fL (ref 81.0–101.0)
MONO#: 0.5 10*3/uL (ref 0.1–0.9)
MONO%: 12.8 % (ref 0.0–13.0)
NEUT#: 2.4 10*3/uL (ref 1.5–6.5)
NEUT%: 60 % (ref 39.6–76.8)
Platelets: 298 10*3/uL (ref 145–400)
RBC: 4.08 10*6/uL (ref 3.70–5.32)
RDW: 14.6 % — ABNORMAL HIGH (ref 11.3–14.5)
WBC: 4 10*3/uL (ref 3.9–10.0)
lymph#: 1 10*3/uL (ref 0.9–3.3)

## 2007-12-29 LAB — LACTATE DEHYDROGENASE: LDH: 133 U/L (ref 94–250)

## 2007-12-29 LAB — CANCER ANTIGEN 27.29: CA 27.29: 23 U/mL (ref 0–39)

## 2008-04-19 ENCOUNTER — Ambulatory Visit: Payer: Self-pay | Admitting: Oncology

## 2008-04-22 LAB — CBC WITH DIFFERENTIAL/PLATELET
BASO%: 0.9 % (ref 0.0–2.0)
Basophils Absolute: 0 10*3/uL (ref 0.0–0.1)
EOS%: 3.8 % (ref 0.0–7.0)
Eosinophils Absolute: 0.2 10*3/uL (ref 0.0–0.5)
HCT: 36.7 % (ref 34.8–46.6)
HGB: 12.8 g/dL (ref 11.6–15.9)
LYMPH%: 19.1 % (ref 14.0–48.0)
MCH: 32.2 pg (ref 26.0–34.0)
MCHC: 35 g/dL (ref 32.0–36.0)
MCV: 92 fL (ref 81.0–101.0)
MONO#: 0.5 10*3/uL (ref 0.1–0.9)
MONO%: 12.7 % (ref 0.0–13.0)
NEUT#: 2.7 10*3/uL (ref 1.5–6.5)
NEUT%: 63.5 % (ref 39.6–76.8)
Platelets: 369 10*3/uL (ref 145–400)
RBC: 3.99 10*6/uL (ref 3.70–5.32)
RDW: 13.3 % (ref 11.3–14.5)
WBC: 4.2 10*3/uL (ref 3.9–10.0)
lymph#: 0.8 10*3/uL — ABNORMAL LOW (ref 0.9–3.3)

## 2008-04-23 LAB — COMPREHENSIVE METABOLIC PANEL
ALT: 11 U/L (ref 0–35)
AST: 9 U/L (ref 0–37)
Albumin: 4.4 g/dL (ref 3.5–5.2)
Alkaline Phosphatase: 66 U/L (ref 39–117)
BUN: 9 mg/dL (ref 6–23)
CO2: 22 mEq/L (ref 19–32)
Calcium: 9 mg/dL (ref 8.4–10.5)
Chloride: 105 mEq/L (ref 96–112)
Creatinine, Ser: 0.65 mg/dL (ref 0.40–1.20)
Glucose, Bld: 83 mg/dL (ref 70–99)
Potassium: 4 mEq/L (ref 3.5–5.3)
Sodium: 140 mEq/L (ref 135–145)
Total Bilirubin: 0.4 mg/dL (ref 0.3–1.2)
Total Protein: 6.7 g/dL (ref 6.0–8.3)

## 2008-04-23 LAB — VITAMIN D 25 HYDROXY (VIT D DEFICIENCY, FRACTURES): Vit D, 25-Hydroxy: 31 ng/mL (ref 30–89)

## 2008-04-23 LAB — LACTATE DEHYDROGENASE: LDH: 123 U/L (ref 94–250)

## 2008-04-23 LAB — CANCER ANTIGEN 27.29: CA 27.29: 20 U/mL (ref 0–39)

## 2008-06-17 IMAGING — CT CT PELVIS W/ CM
2 of 8 series · 15 of 46 positions shown, 17 images · IV contrast (omnipaque)
Comparison: CT chest, abdomen, and pelvis, 03/28/07.

CLINICAL DATA: Breast cancer.  Patient completed chemotherapy on 07/03/07.  
NECK CT WITH CONTRAST:
TECHNIQUE: Multidetector CT imaging of the neck was performed following the standard protocol during administration of intravenous contrast.
Contrast:  100 cc Omnipaque 300
TECHNIQUE: Multidetector CT imaging of the chest was performed following the standard protocol during bolus administration of intravenous contrast.
TECHNIQUE: Multidetector CT imaging of the abdomen was performed following the standard protocol during bolus administration of intravenous contrast.
TECHNIQUE: Multidetector CT imaging of the pelvis was performed following the standard protocol during bolus administration of intravenous contrast.

[Series 2: cap 5.0 b40f · axial · 0.71mm/px · z∈[+1103,+1653]mm · 12 of 132 slices shown, 14 images]
[im 11/132  soft-tissue]
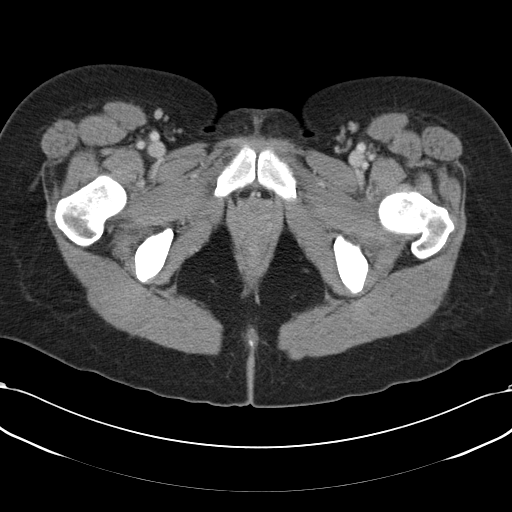
[im 11/132  bone]
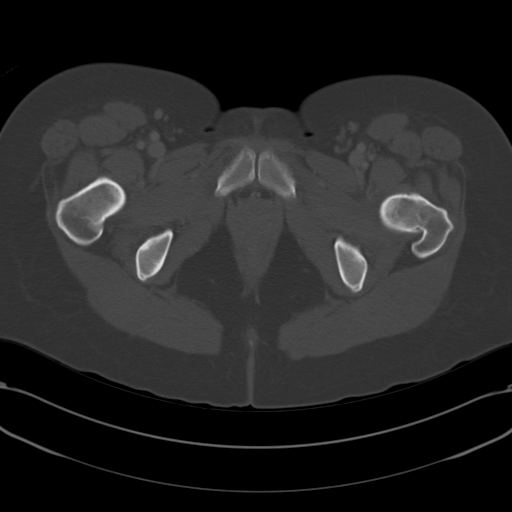
[im 21/132  soft-tissue]
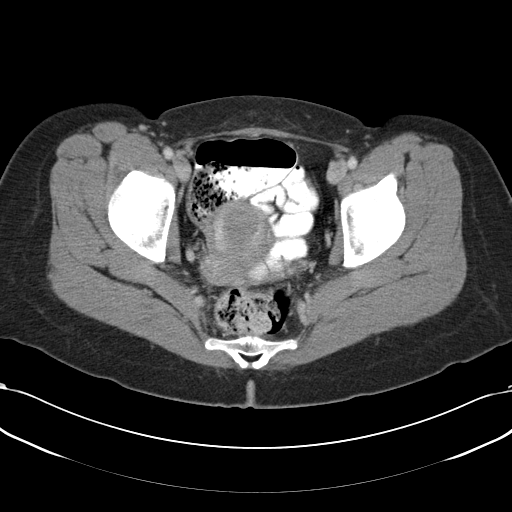
[im 31/132  soft-tissue]
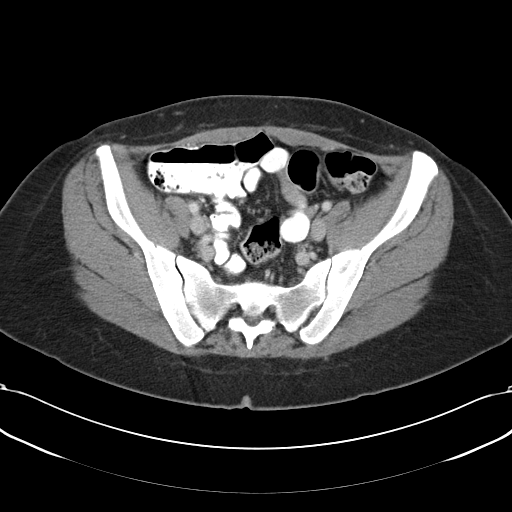
[im 41/132  soft-tissue]
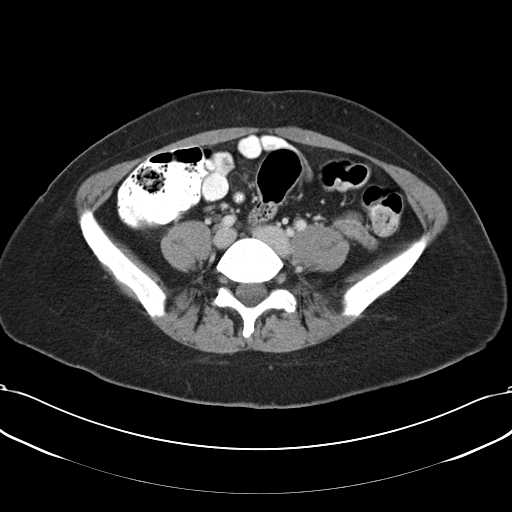
[im 51/132  soft-tissue]
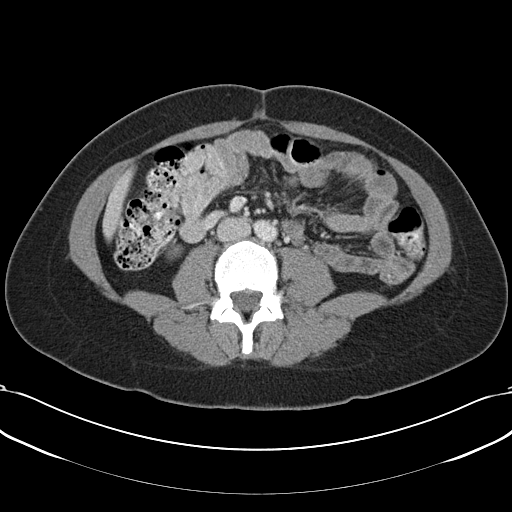
[im 61/132  soft-tissue]
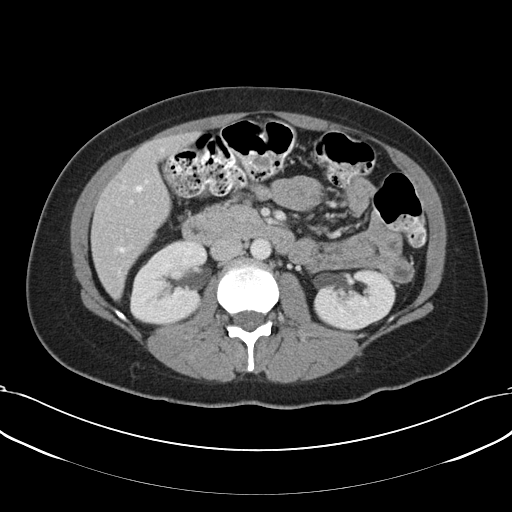
[im 71/132  soft-tissue]
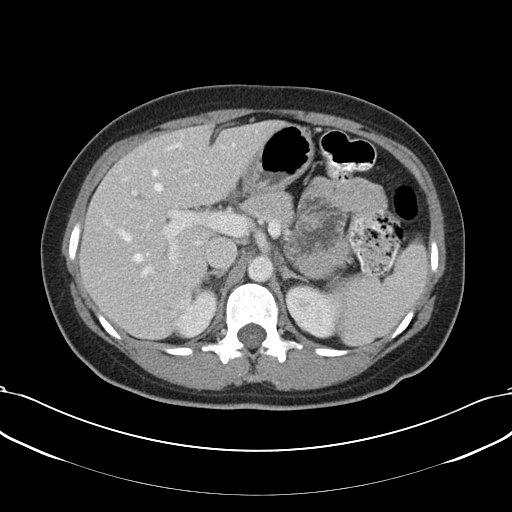
[im 81/132  soft-tissue]
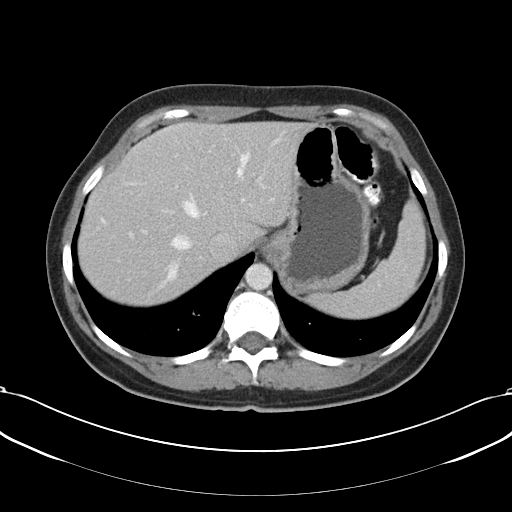
[im 91/132  soft-tissue]
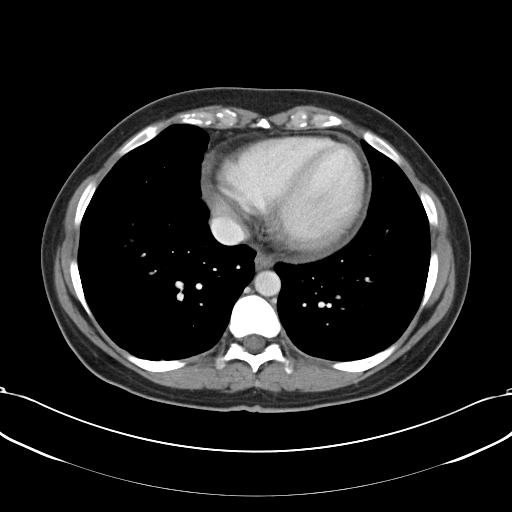
[im 91/132  bone]
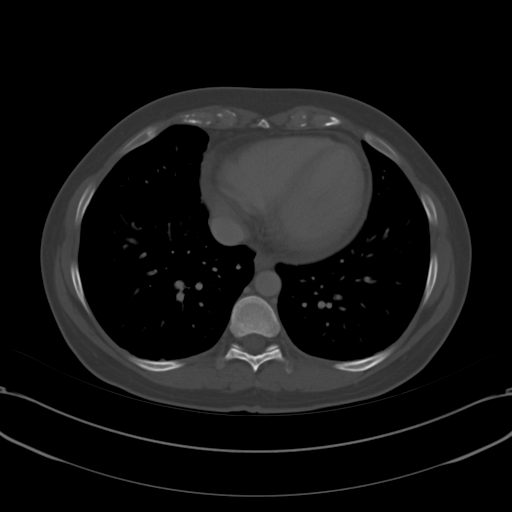
[im 101/132  soft-tissue]
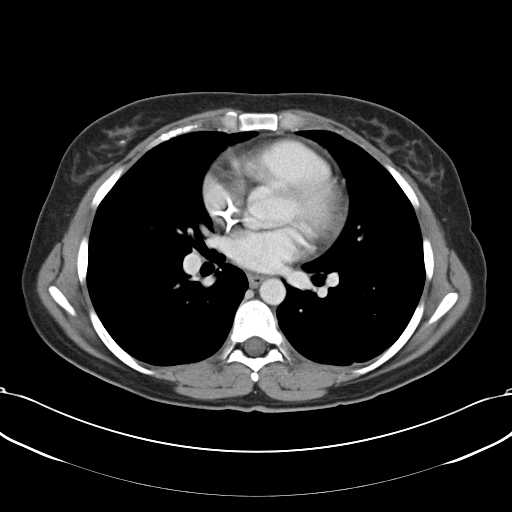
[im 111/132  soft-tissue]
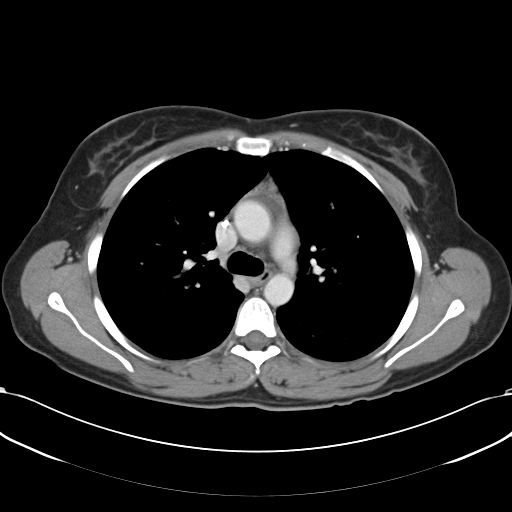
[im 121/132  soft-tissue]
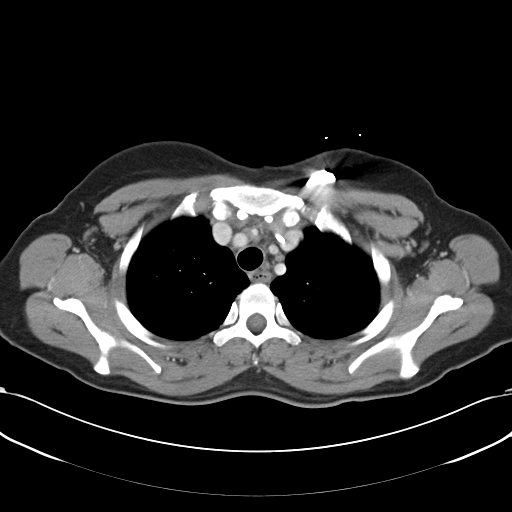

[Series 602: <mpr thick range> · coronal · 0.43mm/px · 3 of 59 slices shown]
[im 15/59  soft-tissue]
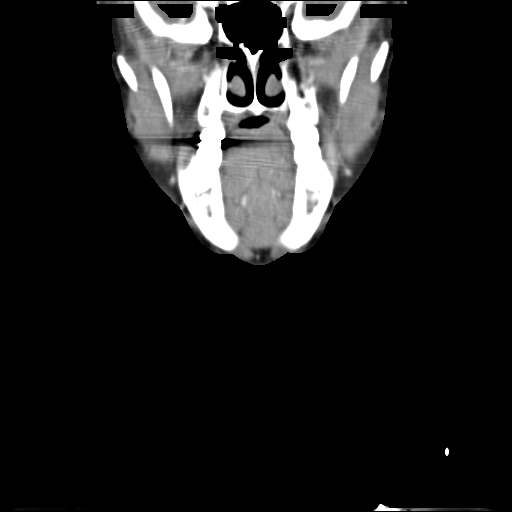
[im 30/59  soft-tissue]
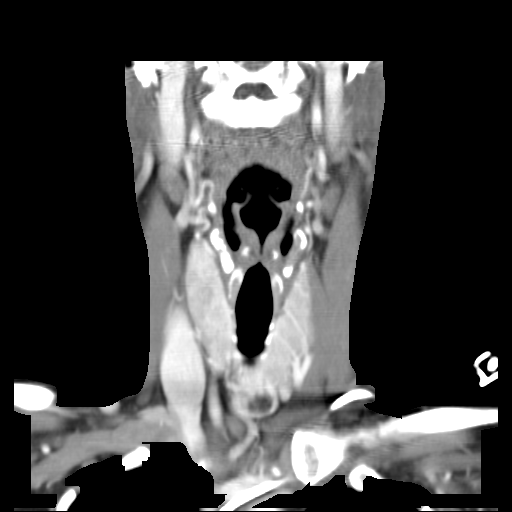
[im 44/59  soft-tissue]
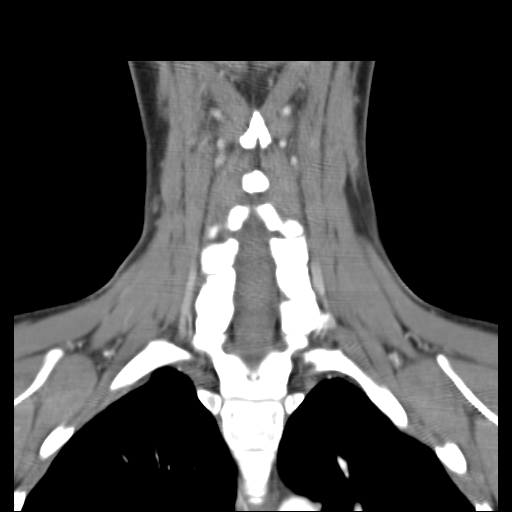

[15 of 46 positions shown; findings below may reference images not displayed]

FINDINGS: No evidence of enhancing or pathologically enlarged lymph nodes within the neck.  There is scattered subcentimeter level IIa lymph nodes on the left and right.  Again demonstrated is a multinodular goiter which is not changed in the interval.
IMPRESSION: 1.  No evidence of metastatic breast cancer within the neck. 
2.  Multinodular goiter unchanged from prior.  Consider thyroid ultrasound. 
CHEST CT WITH CONTRAST:
FINDINGS: There is a port in the left anterior chest wall.  The small right breast mass has decreased in size in the interval and only a single remaining coarse calcification is evident on image 32.  No evidence of axillary lymphadenopathy.  No evidence of mediastinal lymphadenopathy.  Small amount of residual thymus is evident in the anterior mediastinum which is unchanged from prior.  Review of the lung parenchyma demonstrates three 3-5 mm pulmonary nodules, 2 within the right middle lobe and 1 within the left upper lobe which are unchanged from prior.
IMPRESSION: 1.  Interval decrease in size of right breast mass.  
2.  No evidence of mediastinal or axillary adenopathy.  
3.  Stable small pulmonary nodules are likely post inflammatory/infectious.  
ABDOMEN CT WITH CONTRAST:
FINDINGS: Prior cholecystectomy.  The liver, pancreas, adrenal glands, spleen, kidneys, and bowel appear normal.  Small splenules superior to the left upper pole of the kidney.  No evidence of abdominal adenopathy.
IMPRESSION: No evidence of metastasis within the abdomen. 
PELVIS CT WITH CONTRAST:
FINDINGS: No evidence of pelvic adenopathy.  The bladder and uterus appear normal.  The adnexa are normal.  Review of bone windows demonstrates no aggressive osseous lesion.
IMPRESSION: No evidence of metastasis within the pelvis.

## 2008-11-15 ENCOUNTER — Ambulatory Visit: Payer: Self-pay | Admitting: Oncology

## 2008-11-17 LAB — CBC WITH DIFFERENTIAL/PLATELET
BASO%: 0.5 % (ref 0.0–2.0)
Basophils Absolute: 0 10*3/uL (ref 0.0–0.1)
EOS%: 1.8 % (ref 0.0–7.0)
Eosinophils Absolute: 0.1 10*3/uL (ref 0.0–0.5)
HCT: 37.7 % (ref 34.8–46.6)
HGB: 13.1 g/dL (ref 11.6–15.9)
LYMPH%: 21.5 % (ref 14.0–48.0)
MCH: 32.4 pg (ref 26.0–34.0)
MCHC: 34.8 g/dL (ref 32.0–36.0)
MCV: 93 fL (ref 81.0–101.0)
MONO#: 0.6 10*3/uL (ref 0.1–0.9)
MONO%: 9.3 % (ref 0.0–13.0)
NEUT#: 4.3 10*3/uL (ref 1.5–6.5)
NEUT%: 66.9 % (ref 39.6–76.8)
Platelets: 300 10*3/uL (ref 145–400)
RBC: 4.05 10*6/uL (ref 3.70–5.32)
RDW: 13 % (ref 11.3–14.5)
WBC: 6.4 10*3/uL (ref 3.9–10.0)
lymph#: 1.4 10*3/uL (ref 0.9–3.3)

## 2008-11-18 LAB — COMPREHENSIVE METABOLIC PANEL
ALT: 10 U/L (ref 0–35)
AST: 11 U/L (ref 0–37)
Albumin: 4.4 g/dL (ref 3.5–5.2)
Alkaline Phosphatase: 60 U/L (ref 39–117)
BUN: 10 mg/dL (ref 6–23)
CO2: 24 mEq/L (ref 19–32)
Calcium: 9.3 mg/dL (ref 8.4–10.5)
Chloride: 103 mEq/L (ref 96–112)
Creatinine, Ser: 0.66 mg/dL (ref 0.40–1.20)
Glucose, Bld: 92 mg/dL (ref 70–99)
Potassium: 3.8 mEq/L (ref 3.5–5.3)
Sodium: 135 mEq/L (ref 135–145)
Total Bilirubin: 0.3 mg/dL (ref 0.3–1.2)
Total Protein: 7.1 g/dL (ref 6.0–8.3)

## 2008-11-18 LAB — LACTATE DEHYDROGENASE: LDH: 127 U/L (ref 94–250)

## 2008-11-18 LAB — CANCER ANTIGEN 27.29: CA 27.29: 21 U/mL (ref 0–39)

## 2008-11-18 LAB — VITAMIN D 25 HYDROXY (VIT D DEFICIENCY, FRACTURES): Vit D, 25-Hydroxy: 22 ng/mL — ABNORMAL LOW (ref 30–89)

## 2009-05-20 ENCOUNTER — Ambulatory Visit: Payer: Self-pay | Admitting: Oncology

## 2009-05-24 ENCOUNTER — Ambulatory Visit (HOSPITAL_COMMUNITY): Admission: RE | Admit: 2009-05-24 | Discharge: 2009-05-24 | Payer: Self-pay | Admitting: Oncology

## 2009-05-24 LAB — CBC WITH DIFFERENTIAL/PLATELET
BASO%: 0.5 % (ref 0.0–2.0)
Basophils Absolute: 0 10*3/uL (ref 0.0–0.1)
EOS%: 2 % (ref 0.0–7.0)
Eosinophils Absolute: 0.1 10*3/uL (ref 0.0–0.5)
HCT: 40 % (ref 34.8–46.6)
HGB: 13.8 g/dL (ref 11.6–15.9)
LYMPH%: 21 % (ref 14.0–49.7)
MCH: 32.4 pg (ref 25.1–34.0)
MCHC: 34.5 g/dL (ref 31.5–36.0)
MCV: 94 fL (ref 79.5–101.0)
MONO#: 0.7 10*3/uL (ref 0.1–0.9)
MONO%: 12.4 % (ref 0.0–14.0)
NEUT#: 3.4 10*3/uL (ref 1.5–6.5)
NEUT%: 64.1 % (ref 38.4–76.8)
Platelets: 277 10*3/uL (ref 145–400)
RBC: 4.26 10*6/uL (ref 3.70–5.45)
RDW: 12.7 % (ref 11.2–14.5)
WBC: 5.3 10*3/uL (ref 3.9–10.3)
lymph#: 1.1 10*3/uL (ref 0.9–3.3)

## 2009-05-25 LAB — LACTATE DEHYDROGENASE: LDH: 129 U/L (ref 94–250)

## 2009-05-25 LAB — COMPREHENSIVE METABOLIC PANEL
ALT: 10 U/L (ref 0–35)
AST: 12 U/L (ref 0–37)
Albumin: 4.4 g/dL (ref 3.5–5.2)
Alkaline Phosphatase: 62 U/L (ref 39–117)
BUN: 11 mg/dL (ref 6–23)
CO2: 20 mEq/L (ref 19–32)
Calcium: 9 mg/dL (ref 8.4–10.5)
Chloride: 107 mEq/L (ref 96–112)
Creatinine, Ser: 0.78 mg/dL (ref 0.40–1.20)
Glucose, Bld: 98 mg/dL (ref 70–99)
Potassium: 3.9 mEq/L (ref 3.5–5.3)
Sodium: 139 mEq/L (ref 135–145)
Total Bilirubin: 0.6 mg/dL (ref 0.3–1.2)
Total Protein: 7 g/dL (ref 6.0–8.3)

## 2009-05-25 LAB — CANCER ANTIGEN 27.29: CA 27.29: 18 U/mL (ref 0–39)

## 2009-05-25 LAB — VITAMIN D 25 HYDROXY (VIT D DEFICIENCY, FRACTURES): Vit D, 25-Hydroxy: 25 ng/mL — ABNORMAL LOW (ref 30–89)

## 2009-07-19 ENCOUNTER — Encounter: Admission: RE | Admit: 2009-07-19 | Discharge: 2009-07-19 | Payer: Self-pay | Admitting: Internal Medicine

## 2009-11-03 ENCOUNTER — Inpatient Hospital Stay (HOSPITAL_COMMUNITY): Admission: RE | Admit: 2009-11-03 | Discharge: 2009-11-05 | Payer: Self-pay | Admitting: Otolaryngology

## 2009-11-03 ENCOUNTER — Encounter (INDEPENDENT_AMBULATORY_CARE_PROVIDER_SITE_OTHER): Payer: Self-pay | Admitting: Otolaryngology

## 2009-11-03 DIAGNOSIS — C801 Malignant (primary) neoplasm, unspecified: Secondary | ICD-10-CM

## 2009-11-03 HISTORY — DX: Malignant (primary) neoplasm, unspecified: C80.1

## 2009-11-03 HISTORY — PX: THYROIDECTOMY: SHX17

## 2009-11-22 ENCOUNTER — Ambulatory Visit: Payer: Self-pay | Admitting: Oncology

## 2009-11-25 LAB — COMPREHENSIVE METABOLIC PANEL
ALT: 9 U/L (ref 0–35)
AST: 12 U/L (ref 0–37)
Albumin: 4.9 g/dL (ref 3.5–5.2)
Alkaline Phosphatase: 66 U/L (ref 39–117)
BUN: 8 mg/dL (ref 6–23)
CO2: 19 mEq/L (ref 19–32)
Calcium: 9.3 mg/dL (ref 8.4–10.5)
Chloride: 106 mEq/L (ref 96–112)
Creatinine, Ser: 0.85 mg/dL (ref 0.40–1.20)
Glucose, Bld: 98 mg/dL (ref 70–99)
Potassium: 4.1 mEq/L (ref 3.5–5.3)
Sodium: 139 mEq/L (ref 135–145)
Total Bilirubin: 0.5 mg/dL (ref 0.3–1.2)
Total Protein: 7.7 g/dL (ref 6.0–8.3)

## 2009-11-25 LAB — CBC WITH DIFFERENTIAL/PLATELET
BASO%: 0.9 % (ref 0.0–2.0)
Basophils Absolute: 0 10*3/uL (ref 0.0–0.1)
EOS%: 3.8 % (ref 0.0–7.0)
Eosinophils Absolute: 0.2 10*3/uL (ref 0.0–0.5)
HCT: 39 % (ref 34.8–46.6)
HGB: 13.6 g/dL (ref 11.6–15.9)
LYMPH%: 16.7 % (ref 14.0–49.7)
MCH: 32.6 pg (ref 25.1–34.0)
MCHC: 34.9 g/dL (ref 31.5–36.0)
MCV: 93.4 fL (ref 79.5–101.0)
MONO#: 0.6 10*3/uL (ref 0.1–0.9)
MONO%: 10.3 % (ref 0.0–14.0)
NEUT#: 3.9 10*3/uL (ref 1.5–6.5)
NEUT%: 68.3 % (ref 38.4–76.8)
Platelets: 348 10*3/uL (ref 145–400)
RBC: 4.17 10*6/uL (ref 3.70–5.45)
RDW: 12.3 % (ref 11.2–14.5)
WBC: 5.8 10*3/uL (ref 3.9–10.3)
lymph#: 1 10*3/uL (ref 0.9–3.3)

## 2009-11-25 LAB — VITAMIN D 25 HYDROXY (VIT D DEFICIENCY, FRACTURES): Vit D, 25-Hydroxy: 30 ng/mL (ref 30–89)

## 2009-11-25 LAB — CANCER ANTIGEN 27.29: CA 27.29: 24 U/mL (ref 0–39)

## 2009-11-25 LAB — LACTATE DEHYDROGENASE: LDH: 129 U/L (ref 94–250)

## 2009-12-12 ENCOUNTER — Ambulatory Visit (HOSPITAL_COMMUNITY): Admission: RE | Admit: 2009-12-12 | Discharge: 2009-12-12 | Payer: Self-pay | Admitting: Internal Medicine

## 2009-12-14 ENCOUNTER — Encounter (HOSPITAL_COMMUNITY): Admission: RE | Admit: 2009-12-14 | Discharge: 2010-03-01 | Payer: Self-pay | Admitting: Internal Medicine

## 2010-04-27 ENCOUNTER — Encounter: Admission: RE | Admit: 2010-04-27 | Discharge: 2010-04-27 | Payer: Self-pay | Admitting: Internal Medicine

## 2010-05-01 ENCOUNTER — Ambulatory Visit: Payer: Self-pay | Admitting: Oncology

## 2010-05-11 ENCOUNTER — Ambulatory Visit (HOSPITAL_COMMUNITY): Admission: RE | Admit: 2010-05-11 | Discharge: 2010-05-11 | Payer: Self-pay | Admitting: Oncology

## 2010-06-14 ENCOUNTER — Ambulatory Visit: Payer: Self-pay | Admitting: Oncology

## 2010-06-16 LAB — CBC WITH DIFFERENTIAL/PLATELET
BASO%: 0.6 % (ref 0.0–2.0)
Basophils Absolute: 0 10*3/uL (ref 0.0–0.1)
EOS%: 2.3 % (ref 0.0–7.0)
Eosinophils Absolute: 0.1 10*3/uL (ref 0.0–0.5)
HCT: 39.6 % (ref 34.8–46.6)
HGB: 13.6 g/dL (ref 11.6–15.9)
LYMPH%: 24.5 % (ref 14.0–49.7)
MCH: 31.7 pg (ref 25.1–34.0)
MCHC: 34.3 g/dL (ref 31.5–36.0)
MCV: 92.5 fL (ref 79.5–101.0)
MONO#: 0.6 10*3/uL (ref 0.1–0.9)
MONO%: 11.3 % (ref 0.0–14.0)
NEUT#: 3.4 10*3/uL (ref 1.5–6.5)
NEUT%: 61.3 % (ref 38.4–76.8)
Platelets: 281 10*3/uL (ref 145–400)
RBC: 4.28 10*6/uL (ref 3.70–5.45)
RDW: 13.3 % (ref 11.2–14.5)
WBC: 5.6 10*3/uL (ref 3.9–10.3)
lymph#: 1.4 10*3/uL (ref 0.9–3.3)

## 2010-06-19 LAB — LACTATE DEHYDROGENASE: LDH: 157 U/L (ref 94–250)

## 2010-06-19 LAB — COMPREHENSIVE METABOLIC PANEL
ALT: 14 U/L (ref 0–35)
AST: 15 U/L (ref 0–37)
Albumin: 4.6 g/dL (ref 3.5–5.2)
Alkaline Phosphatase: 77 U/L (ref 39–117)
BUN: 10 mg/dL (ref 6–23)
CO2: 26 mEq/L (ref 19–32)
Calcium: 10 mg/dL (ref 8.4–10.5)
Chloride: 103 mEq/L (ref 96–112)
Creatinine, Ser: 0.79 mg/dL (ref 0.40–1.20)
Glucose, Bld: 96 mg/dL (ref 70–99)
Potassium: 3.8 mEq/L (ref 3.5–5.3)
Sodium: 140 mEq/L (ref 135–145)
Total Bilirubin: 0.6 mg/dL (ref 0.3–1.2)
Total Protein: 7.3 g/dL (ref 6.0–8.3)

## 2010-06-19 LAB — CANCER ANTIGEN 27.29: CA 27.29: 18 U/mL (ref 0–39)

## 2010-06-19 LAB — THYROGLOBULIN LEVEL
Thyroglobulin Ab: <20 U/mL (ref <40.0)
Thyroglobulin: <0.2 ng/mL (ref 0.0–55.0)

## 2010-06-19 LAB — VITAMIN D 25 HYDROXY (VIT D DEFICIENCY, FRACTURES): Vit D, 25-Hydroxy: 31 ng/mL (ref 30–89)

## 2010-10-22 ENCOUNTER — Encounter: Payer: Self-pay | Admitting: Internal Medicine

## 2010-10-22 ENCOUNTER — Encounter: Payer: Self-pay | Admitting: General Surgery

## 2010-11-30 ENCOUNTER — Other Ambulatory Visit (HOSPITAL_COMMUNITY): Payer: Self-pay | Admitting: Internal Medicine

## 2010-11-30 DIAGNOSIS — Z85850 Personal history of malignant neoplasm of thyroid: Secondary | ICD-10-CM

## 2010-12-18 LAB — CBC
HCT: 37.2 % (ref 36.0–46.0)
Hemoglobin: 12.6 g/dL (ref 12.0–15.0)
MCHC: 33.8 g/dL (ref 30.0–36.0)
MCV: 96.5 fL (ref 78.0–100.0)
Platelets: 323 10*3/uL (ref 150–400)
RBC: 3.86 MIL/uL — ABNORMAL LOW (ref 3.87–5.11)
RDW: 12.7 % (ref 11.5–15.5)
WBC: 6.7 10*3/uL (ref 4.0–10.5)

## 2010-12-18 LAB — CALCIUM: Calcium: 9.4 mg/dL (ref 8.4–10.5)

## 2010-12-21 LAB — CALCIUM
Calcium: 8.3 mg/dL — ABNORMAL LOW (ref 8.4–10.5)
Calcium: 8.3 mg/dL — ABNORMAL LOW (ref 8.4–10.5)
Calcium: 8.5 mg/dL (ref 8.4–10.5)
Calcium: 8.6 mg/dL (ref 8.4–10.5)

## 2010-12-22 ENCOUNTER — Other Ambulatory Visit: Payer: Self-pay | Admitting: Oncology

## 2010-12-22 ENCOUNTER — Encounter (HOSPITAL_BASED_OUTPATIENT_CLINIC_OR_DEPARTMENT_OTHER): Payer: PRIVATE HEALTH INSURANCE | Admitting: Oncology

## 2010-12-22 DIAGNOSIS — C50319 Malignant neoplasm of lower-inner quadrant of unspecified female breast: Secondary | ICD-10-CM

## 2010-12-22 DIAGNOSIS — C50919 Malignant neoplasm of unspecified site of unspecified female breast: Secondary | ICD-10-CM

## 2010-12-25 LAB — HCG, SERUM, QUALITATIVE

## 2011-01-08 ENCOUNTER — Encounter (HOSPITAL_COMMUNITY)
Admission: RE | Admit: 2011-01-08 | Discharge: 2011-01-08 | Disposition: A | Discharge status: Home / Self Care | Payer: PRIVATE HEALTH INSURANCE | Source: Ambulatory Visit | Attending: Internal Medicine | Admitting: Internal Medicine

## 2011-01-08 DIAGNOSIS — C73 Malignant neoplasm of thyroid gland: Secondary | ICD-10-CM | POA: Insufficient documentation

## 2011-01-08 DIAGNOSIS — Z85850 Personal history of malignant neoplasm of thyroid: Secondary | ICD-10-CM

## 2011-01-08 DIAGNOSIS — E0789 Other specified disorders of thyroid: Secondary | ICD-10-CM | POA: Insufficient documentation

## 2011-01-09 ENCOUNTER — Encounter (HOSPITAL_COMMUNITY)
Admission: RE | Admit: 2011-01-09 | Discharge: 2011-01-09 | Disposition: A | Discharge status: Home / Self Care | Payer: PRIVATE HEALTH INSURANCE | Source: Ambulatory Visit | Attending: Internal Medicine | Admitting: Internal Medicine

## 2011-01-10 ENCOUNTER — Encounter (HOSPITAL_COMMUNITY)
Admission: RE | Admit: 2011-01-10 | Discharge: 2011-01-10 | Disposition: A | Discharge status: Home / Self Care | Payer: PRIVATE HEALTH INSURANCE | Source: Ambulatory Visit | Attending: Internal Medicine | Admitting: Internal Medicine

## 2011-01-10 DIAGNOSIS — C73 Malignant neoplasm of thyroid gland: Secondary | ICD-10-CM | POA: Insufficient documentation

## 2011-01-10 LAB — HCG, SERUM, QUALITATIVE: Preg, Serum: NEGATIVE

## 2011-01-12 ENCOUNTER — Encounter (HOSPITAL_COMMUNITY)
Admission: RE | Admit: 2011-01-12 | Discharge: 2011-01-12 | Disposition: A | Discharge status: Home / Self Care | Payer: PRIVATE HEALTH INSURANCE | Source: Ambulatory Visit | Attending: Internal Medicine | Admitting: Internal Medicine

## 2011-01-12 DIAGNOSIS — C73 Malignant neoplasm of thyroid gland: Secondary | ICD-10-CM | POA: Insufficient documentation

## 2011-01-12 MED ORDER — SODIUM IODIDE I 131 CAPSULE
4.0000 | Freq: Once | INTRAVENOUS | Status: AC | PRN
  Administered 2011-01-12: 4 via ORAL

## 2011-01-15 LAB — THYROID ANTIBODIES
Thyroglobulin Ab: <20 U/mL (ref <40.0)
Thyroperoxidase Ab SerPl-aCnc: 25.8 IU/mL (ref <35.0)

## 2011-01-15 LAB — THYROGLOBULIN LEVEL: Thyroglobulin: <0.2 ng/mL (ref 0.0–55.0)

## 2011-02-16 NOTE — H&P (Signed)
Bonnie Harper, Bonnie Harper             ACCOUNT NO.:  [id number]   MEDICAL RECORD NO.:  [id number]          PATIENT TYPE:  EMS   LOCATION:  ED                           FACILITY:  [facility]   PHYSICIAN:  Angelia Mould. Derrell Lolling, M.D.DATE OF BIRTH:  14-Dec-1975   DATE OF ADMISSION:  04/30/2006  DATE OF DISCHARGE:                                HISTORY & PHYSICAL   CHIEF COMPLAINT:  Back pain, abdominal pain, nausea.   HISTORY OF PRESENT ILLNESS:  This is a 35 year old white female who gives a  three-month history of intermittent episodes of back pain, right upper  quadrant pain, nausea and dry heaves.  This is exacerbated by meals.  She  has irritable bowel syndrome and has attributing this to that.  She has been  very busy with work and has not sought medical attention.  Two days ago, she  started having more nausea and back pain which persisted until today, when  the right upper quadrant pain became quite severe.  She has not had any  fever, liver disease, jaundice, urologic problems, cardiovascular or  pulmonary problems.   She saw Dr. Rosezetta Schlatter today.  He was concerned about her gallbladder.  He  sent her to [facility].  Dr. Stephani Police states that her  ultrasound shows a small gallstone but no other abnormalities, specifically  no wall thickening or fluid collection, and the common bile duct has not  dilated, and there were no other abnormalities seen of the liver, spleen,  kidneys.  Because she was still having pain and nausea, we brought her to  the [facility] Emergency Room, and she is being admitted for further  evaluation and management.   PAST HISTORY:  1.  Tonsillectomy.  2.  Cesarean section x2.  3.  She has been told she has irritable bowel syndrome with intermittent      diarrhea.  She has anxiety.   CURRENT MEDICATIONS:  1.  Jasmine for birth control, hyoscyamine 0.375 mg b.i.d.  2.  Lomotil 4 times a day p.r.n. diarrhea.  3.  Librax 1 before meals and at  bedtime.   DRUG ALLERGIES:  NONE KNOWN.   SOCIAL HISTORY:  She is married, has two children, works in a Therapist, occupational at [employer], denies the use of alcohol or tobacco.   FAMILY HISTORY:  Mother living and well.  Father living, has asthma and type  2 diabetes.  Maternal grandmother had breast cancer.  One sibling had a  cholecystectomy at age 24.   REVIEW OF SYSTEMS:  A 15-system review of systems is performed and is  noncontributory, except as described above.   PHYSICAL EXAMINATION:  GENERAL:  Pleasant, healthy-appearing young woman in  mild distress.  Her mother is with her.  VITAL SIGNS:  Temp 98.1, pulse 75, respirations 20, blood pressure 148/80.  EYES:  Sclerae clear, extraocular movements intact.  EAR, NOSE, MOUTH AND THROAT:  Nose, lips, tongue and oropharynx are without  gross lesions.  NECK:  Supple, nontender, no mass or jugular venous distention.  LUNGS:  Clear to auscultation.  She does have right costovertebral angle  tenderness; otherwise no chest wall tenderness.  HEART:  Regular rate and rhythm, no murmur.  ABDOMEN:  Soft, not distended, subjectively tender in the right upper  quadrant but no mass and no guarding.  The rest of the abdomen is fairly  benign.  She has a well-healed Pfannenstiel incision, no hernias or masses  noted.  GENITOURINARY:  No inguinal hernia or inguinal adenopathy.  EXTREMITIES:  She moves all four extremities well without pain or deformity.  NEUROLOGIC:  No gross motor or sensory deficits.   ADMISSION DATA:  Ultrasound consistent with gallstones as described above.  Hemoglobin 14.1, white blood cell count 6,300, lipase 28, urine pregnancy  test negative, liver function test normal.   ASSESSMENT:  1.  Acute and chronic cholecystitis with cholelithiasis, now with      accelerating biliary colic.  2.  Status post cesarean section x2.   PLAN:  1.  The patient will be admitted for pain control, nausea control and IV       antibiotics.  2.  She will be taken to the operating room within the next few hours for a      cholecystectomy.  3.  I have discussed the indications and details of cholecystectomy with the      patient and her mother.  Risks and complications have been outlined,      including but not limited to bleeding, infection, conversion to open      laparotomy, possibility of other diagnoses, injury to adjacent organs      such as the main bile duct or intestine with major reconstructive      surgery, wound problems such as infection or hernia, cardiac, pulmonary      and thromboembolic problems.  She seems to understand these issues well.      At this time, all of her questions are answered.  She is in full      agreement with this plan.      Angelia Mould. Derrell Lolling, M.D.  Electronically Signed     HMI/MEDQ  D:  04/30/2006  T:  04/30/2006  Job:  [id number]   cc:   Marjory Lies, M.D.  Fax: [phone]

## 2011-02-16 NOTE — Op Note (Signed)
Bonnie Harper, Bonnie Harper             ACCOUNT NO.:  [id number]   MEDICAL RECORD NO.:  [id number]          PATIENT TYPE:  INP   LOCATION:  0098                         FACILITY:  [facility]   PHYSICIAN:  Angelia Mould. Derrell Lolling, M.D.DATE OF BIRTH:  1975-12-20   DATE OF PROCEDURE:  04/30/2006  DATE OF DISCHARGE:                                 OPERATIVE REPORT   PREOPERATIVE DIAGNOSIS:  Acute and chronic cholecystitis with  cholelithiasis.   POSTOPERATIVE DIAGNOSIS:  Acute and chronic cholecystitis with  cholelithiasis.   OPERATION PERFORMED:  Laparoscopic cholecystectomy with intraoperative  cholangiogram.   SURGEON:  Dr. Claud Kelp.   FIRST ASSISTANT:  Dr. Jerelene Redden.   OPERATIVE INDICATIONS:  This is a 35 year old white female who has a 52-month  history consistent with intermittent biliary colic.  She had a more severe  attack today which she could not tolerate, and saw Dr. Marjory Lies.  He  send her for an ultrasound which shows gallstones, but no other  abnormalities.  On exam she has mild right upper quadrant tenderness.  Her  white blood cell count and liver function test and amylase and pregnancy  test are normal.  I felt that she was having accelerating biliary colic.  Because of persistent pain and nausea, I offered her the option of coming in  and having the surgery at this time, which she was very much in favor of.  She is brought to operating room on the day of admission.   OPERATIVE FINDINGS:  The gallbladder was edematous, but there was no  evidence of any gangrene or inflammatory exudate.  The cystic duct and  cystic artery were tiny.  The cholangiogram was normal, showing normal  intrahepatic and extrahepatic biliary anatomy, no filling defects, and no  obstruction with good flow of contrast into the duodenum.  The stomach,  duodenum, liver, small intestine, large intestine and peritoneal surfaces  otherwise looked normal.   OPERATIVE TECHNIQUE:  Following the  induction of general endotracheal  anesthesia, the patient's abdomen was prepped and draped in a sterile  fashion.  Intravenous antibiotics were given preoperatively.  Marcaine 0.5%  with epinephrine was used as a local infiltration anesthetic.  A vertically  oriented incision was made inside the lower rim of the umbilicus.  The  fascia was incised in the midline, and the abdominal cavity entered under  direct vision.  A 10-mm Hasson trocar was inserted and secured with a  pursestring suture of 0-0 Vicryl.  Pneumoperitoneum was created.  Video  camera was inserted with visualization and findings as described above.  A  10-mm trocar was placed in the subxiphoid region and two 5-mm trocars placed  in the right mid abdomen.  The gallbladder fundus was grasped and elevated.  Some adhesions were dissected away from the lower body and infundibulum of  the gallbladder, and then we were able to retract the infundibulum  laterally.  We dissected out the cystic duct and the cystic artery.  We  isolated the cystic artery as it went onto the wall of the gallbladder,  secured with multiple metal clips and divided it.  We thus created a large  window behind the cystic duct.  The cholangiogram catheter was inserted into  the cystic duct.  A cholangiogram was obtained using the C-arm.  The  cholangiogram was normal, as described above.  The cholangiogram catheter  was removed, and the cystic duct was secured multiple metal clips and  divided.  The gallbladder was dissected from its bed with electrocautery,  placed in the specimen bag and removed.  The operative field was copiously  irrigated.  Hemostasis was excellent.  At the completion of the case there  was no bleeding and no bile leak whatsoever, and the irrigation fluid was  completely clear.  All the irrigation fluid above and below the liver was  removed.  The trocars were removed under direct vision.  There was no  bleeding from trocar sites.   Pneumoperitoneum was released.  The fascia at  the umbilicus was closed with 0-0 Vicryl sutures.  The skin incisions were  closed with subcuticular sutures of 4-0 Monocryl and Steri-Strips.  Clean  bandages were placed and the patient taken to recovery room in stable  condition.  Estimated blood loss was about 10 mL or less.   COMPLICATIONS:  None.  Sponge, needle and instrument counts were correct.      Angelia Mould. Derrell Lolling, M.D.  Electronically Signed     HMI/MEDQ  D:  04/30/2006  T:  04/30/2006  Job:  [id number]   cc:   Marjory Lies, M.D.  Fax: [phone]

## 2011-05-07 ENCOUNTER — Ambulatory Visit (HOSPITAL_COMMUNITY)
Admission: RE | Admit: 2011-05-07 | Discharge: 2011-05-07 | Disposition: A | Discharge status: Home / Self Care | Payer: PRIVATE HEALTH INSURANCE | Source: Ambulatory Visit | Attending: Oncology | Admitting: Oncology

## 2011-05-07 DIAGNOSIS — C50919 Malignant neoplasm of unspecified site of unspecified female breast: Secondary | ICD-10-CM | POA: Insufficient documentation

## 2011-05-07 MED ORDER — GADOBENATE DIMEGLUMINE 529 MG/ML IV SOLN
20.0000 mL | Freq: Once | INTRAVENOUS | Status: AC | PRN
  Administered 2011-05-07: 20 mL via INTRAVENOUS

## 2011-07-24 ENCOUNTER — Encounter (HOSPITAL_BASED_OUTPATIENT_CLINIC_OR_DEPARTMENT_OTHER): Payer: PRIVATE HEALTH INSURANCE | Admitting: Oncology

## 2011-07-24 DIAGNOSIS — Z9010 Acquired absence of unspecified breast and nipple: Secondary | ICD-10-CM

## 2011-07-24 DIAGNOSIS — Z853 Personal history of malignant neoplasm of breast: Secondary | ICD-10-CM

## 2011-07-24 DIAGNOSIS — E0789 Other specified disorders of thyroid: Secondary | ICD-10-CM

## 2011-11-12 ENCOUNTER — Other Ambulatory Visit: Payer: Self-pay | Admitting: Internal Medicine

## 2011-11-12 DIAGNOSIS — C73 Malignant neoplasm of thyroid gland: Secondary | ICD-10-CM

## 2011-11-21 ENCOUNTER — Ambulatory Visit
Admission: RE | Admit: 2011-11-21 | Discharge: 2011-11-21 | Disposition: A | Discharge status: Home / Self Care | Payer: PRIVATE HEALTH INSURANCE | Source: Ambulatory Visit | Attending: Internal Medicine | Admitting: Internal Medicine

## 2011-11-21 DIAGNOSIS — C73 Malignant neoplasm of thyroid gland: Secondary | ICD-10-CM

## 2012-01-07 ENCOUNTER — Other Ambulatory Visit: Payer: Self-pay | Admitting: *Deleted

## 2012-01-15 ENCOUNTER — Other Ambulatory Visit: Payer: PRIVATE HEALTH INSURANCE | Admitting: Lab

## 2012-01-22 ENCOUNTER — Ambulatory Visit (HOSPITAL_BASED_OUTPATIENT_CLINIC_OR_DEPARTMENT_OTHER): Payer: PRIVATE HEALTH INSURANCE | Admitting: Oncology

## 2012-01-22 ENCOUNTER — Telehealth: Payer: Self-pay | Admitting: Oncology

## 2012-01-22 VITALS — BP 135/84 | HR 92 | Temp 98.4°F | Ht 64.0 in | Wt 188.6 lb

## 2012-01-22 DIAGNOSIS — I890 Lymphedema, not elsewhere classified: Secondary | ICD-10-CM

## 2012-01-22 DIAGNOSIS — E559 Vitamin D deficiency, unspecified: Secondary | ICD-10-CM

## 2012-01-22 DIAGNOSIS — Z171 Estrogen receptor negative status [ER-]: Secondary | ICD-10-CM

## 2012-01-22 DIAGNOSIS — C50919 Malignant neoplasm of unspecified site of unspecified female breast: Secondary | ICD-10-CM

## 2012-01-22 DIAGNOSIS — R0789 Other chest pain: Secondary | ICD-10-CM

## 2012-01-22 MED ORDER — OXYCODONE-ACETAMINOPHEN 10-325 MG PO TABS: 1.0000 | ORAL_TABLET | ORAL | Status: AC | PRN

## 2012-01-22 MED ORDER — TRAMADOL HCL 50 MG PO TABS: 50.0000 mg | ORAL_TABLET | Freq: Four times a day (QID) | ORAL | Status: AC | PRN

## 2012-01-22 NOTE — Telephone Encounter (Signed)
gve the pt her oct 2013 apppt calendar along with the mri appt in may. S/w April from the lymphedema clinic regarding the referral. Pt is aware she will be contacted with that appt.

## 2012-01-22 NOTE — Progress Notes (Signed)
Hematology and Oncology Follow Up Visit  Bonnie Harper [id number] [birth year] 36 y.o. 01/22/2012 3:02 PM PCP  1. Principle Diagnosis: Locally advanced breast cancer, status post completion neoadjuvant chemotherapy with complete pathological response, ER negative, PR negative, status post bilateral mastectomies, status post tissue expanders at initial mastectomy 08/31/2007.  History of thyroidectomy 11/03/2009 , secondary to thyroid cancer , on thyroid replacement.  Interim History:  There have been no intercurrent illness, hospitalizations or medication changes. She's been doing reasonably well. She has knows over the past 6 months and progressive changes in her right arm with swelling and discomfort. She is taking quite an abnormal. The pain is worse at night and certainly she is having some difficulty sleeping as a result. She is also medicine discomfort emanating from her chest on the right side.  Medications: I have reviewed the patient's current medications.  Allergies:  Allergies  Allergen Reactions  . Zofran Other (See Comments)    migraines    Past Medical History, Surgical history, Social history, and Family History were reviewed and updated.  Review of Systems: Constitutional:  Negative for fever, chills, night sweats, anorexia, weight loss, pain. Cardiovascular: no chest pain or dyspnea on exertion Respiratory: no cough, shortness of breath, or wheezing Neurological: no TIA or stroke symptoms Dermatological: negative ENT: negative Skin Gastrointestinal: negative Genito-Urinary: negative Hematological and Lymphatic: negative Breast: negative Musculoskeletal: positive for - muscle pain and swelling in arm - right Remaining ROS negative.  Physical Exam: Blood pressure 135/84, pulse 92, temperature 98.4 F (36.9 C), height 5\' 4"  (1.626 m), weight 188 lb 9.6 oz (85.548 kg). ECOG: 0 General appearance: alert, cooperative and appears stated age Head: Normocephalic,  without obvious abnormality, atraumatic Neck: no adenopathy, no carotid bruit, no JVD, supple, symmetrical, trachea midline and thyroid not enlarged, symmetric, no tenderness/mass/nodules Lymph nodes: Cervical, supraclavicular, and axillary nodes normal. Cardiac : regular rate and rhythm, no murmurs or gallops Pulmonary:clear to auscultation bilaterally and normal percussion bilaterally Breasts: inspection negative, no nipple discharge or bleeding, no masses or nodularity palpable, both breasts are status post TRAM flaps. There is no evidence of local recurrence tenderness or masses. Abdomen:soft, non-tender; bowel sounds normal; no masses,  no organomegaly Extremities her right extremity is somewhat swollen left. There is no tenderness along the arm. I cannot take any axillary adenopathy. Neuro: alert, oriented, normal speech, no focal findings or movement disorder noted  Lab Results: Lab Results  Component Value Date   WBC 5.6 06/16/2010   HGB 13.6 06/16/2010   HCT 39.6 06/16/2010   MCV 92.5 06/16/2010   PLT 281 06/16/2010     Chemistry      Component Value Date/Time   NA 140 06/16/2010 1333   K 3.8 06/16/2010 1333   CL 103 06/16/2010 1333   CO2 26 06/16/2010 1333   BUN 10 06/16/2010 1333   CREATININE 0.79 06/16/2010 1333      Component Value Date/Time   CALCIUM 10.0 06/16/2010 1333   ALKPHOS 77 06/16/2010 1333   AST 15 06/16/2010 1333   ALT 14 06/16/2010 1333   BILITOT 0.6 06/16/2010 1333      .pathology. Radiological Studies: chest X-ray n/a Mammogram n/a Bone density n/a  Impression and Plan: The patient is doing reasonably well but has developed lymphedema over the past few months. This is unusual and that it's a somewhat delayed given the timeframe from initial surgery. Her last MRI scan of her breasts done in August of 2004 was normal. I recommend repeat another one  in May testicular from his on going on in her chest area. As far as her lymphedema is concerned I am referring  her to lymphedema clinic. In addition I've given her prescription today for Ultram as was Percocet which he can take at nighttime and she works full-time. I will plan to see her again in followup in 6 months time. She knows to call should she have any other concerns. Of note is her thyroid function is that she stabilized.  More than 50% of the visit was spent in patient-related counselling   Pierce Crane, MD 4/23/20133:02 PM

## 2012-01-22 NOTE — Telephone Encounter (Signed)
Per pt's request she prefers to have hwer labs drawn at [facility]. She will call ahead to hve the lab orders faxed over.

## 2012-02-18 ENCOUNTER — Ambulatory Visit: Payer: PRIVATE HEALTH INSURANCE | Attending: Oncology | Admitting: Physical Therapy

## 2012-02-18 DIAGNOSIS — IMO0001 Reserved for inherently not codable concepts without codable children: Secondary | ICD-10-CM | POA: Insufficient documentation

## 2012-02-18 DIAGNOSIS — I890 Lymphedema, not elsewhere classified: Secondary | ICD-10-CM | POA: Insufficient documentation

## 2012-02-21 ENCOUNTER — Ambulatory Visit (HOSPITAL_COMMUNITY)
Admission: RE | Admit: 2012-02-21 | Discharge: 2012-02-21 | Disposition: A | Discharge status: Home / Self Care | Payer: PRIVATE HEALTH INSURANCE | Source: Ambulatory Visit | Attending: Oncology | Admitting: Oncology

## 2012-02-21 DIAGNOSIS — E559 Vitamin D deficiency, unspecified: Secondary | ICD-10-CM | POA: Insufficient documentation

## 2012-02-21 DIAGNOSIS — Z9010 Acquired absence of unspecified breast and nipple: Secondary | ICD-10-CM | POA: Insufficient documentation

## 2012-02-21 DIAGNOSIS — N63 Unspecified lump in unspecified breast: Secondary | ICD-10-CM | POA: Insufficient documentation

## 2012-02-21 DIAGNOSIS — C50919 Malignant neoplasm of unspecified site of unspecified female breast: Secondary | ICD-10-CM | POA: Insufficient documentation

## 2012-02-21 MED ORDER — GADOBENATE DIMEGLUMINE 529 MG/ML IV SOLN
17.0000 mL | Freq: Once | INTRAVENOUS | Status: AC | PRN
  Administered 2012-02-21: 17 mL via INTRAVENOUS

## 2012-03-25 ENCOUNTER — Ambulatory Visit: Payer: PRIVATE HEALTH INSURANCE | Attending: Internal Medicine | Admitting: Physical Therapy

## 2012-03-25 DIAGNOSIS — I890 Lymphedema, not elsewhere classified: Secondary | ICD-10-CM | POA: Insufficient documentation

## 2012-03-25 DIAGNOSIS — IMO0001 Reserved for inherently not codable concepts without codable children: Secondary | ICD-10-CM | POA: Insufficient documentation

## 2012-04-02 ENCOUNTER — Ambulatory Visit: Payer: BC Managed Care – PPO | Attending: Internal Medicine

## 2012-04-02 DIAGNOSIS — M25619 Stiffness of unspecified shoulder, not elsewhere classified: Secondary | ICD-10-CM | POA: Insufficient documentation

## 2012-04-02 DIAGNOSIS — IMO0001 Reserved for inherently not codable concepts without codable children: Secondary | ICD-10-CM | POA: Insufficient documentation

## 2012-04-02 DIAGNOSIS — I890 Lymphedema, not elsewhere classified: Secondary | ICD-10-CM | POA: Insufficient documentation

## 2012-04-07 ENCOUNTER — Ambulatory Visit: Payer: BC Managed Care – PPO | Admitting: Physical Therapy

## 2012-04-09 ENCOUNTER — Ambulatory Visit: Payer: BC Managed Care – PPO | Admitting: Physical Therapy

## 2012-07-10 ENCOUNTER — Telehealth: Payer: Self-pay | Admitting: *Deleted

## 2012-07-10 NOTE — Telephone Encounter (Signed)
Per pt request an order for labs to be drawn has been faxed to [facility] on [address] Pt has been notified

## 2012-07-28 ENCOUNTER — Encounter: Payer: Self-pay | Admitting: Oncology

## 2012-07-31 ENCOUNTER — Ambulatory Visit (HOSPITAL_BASED_OUTPATIENT_CLINIC_OR_DEPARTMENT_OTHER): Payer: BC Managed Care – PPO | Admitting: Oncology

## 2012-07-31 ENCOUNTER — Telehealth: Payer: Self-pay | Admitting: *Deleted

## 2012-07-31 VITALS — BP 137/94 | HR 73 | Temp 98.3°F | Resp 20 | Ht 64.0 in | Wt 188.8 lb

## 2012-07-31 DIAGNOSIS — C50919 Malignant neoplasm of unspecified site of unspecified female breast: Secondary | ICD-10-CM

## 2012-07-31 DIAGNOSIS — I890 Lymphedema, not elsewhere classified: Secondary | ICD-10-CM

## 2012-07-31 DIAGNOSIS — Z171 Estrogen receptor negative status [ER-]: Secondary | ICD-10-CM

## 2012-07-31 DIAGNOSIS — M79609 Pain in unspecified limb: Secondary | ICD-10-CM

## 2012-07-31 NOTE — Progress Notes (Signed)
Hematology and Oncology Follow Up Visit  Bonnie Harper [id number] [birth year] 36 y.o. 07/31/2012 10:34 AM PCP  1. Principle Diagnosis: Locally advanced breast cancer, status post completion neoadjuvant chemotherapy with complete pathological response, ER negative, PR negative, status post bilateral mastectomies, status post tissue expanders at initial mastectomy 08/31/2007.  History of thyroidectomy 11/03/2009 , secondary to thyroid cancer , on thyroid replacement.  Interim History:  There have been no intercurrent illness, hospitalizations or medication changes. She's been doing reasonably well. Since being seen last she has been to lymphedema clinic which is helped her swelling quite a bit. She has periodic pain under her arm. She takes Ultram again today and some other meds and medication at nighttime to help with this. She is does not use a sleeve but does manually pump her arm. We did an MRI scan of her breast and chest back in 6 months ago which was normal. Medications: I have reviewed the patient's current medications.  Allergies:  Allergies  Allergen Reactions  . Zofran Other (See Comments)    migraines    Past Medical History, Surgical history, Social history, and Family History were reviewed and updated.  Review of Systems: Constitutional:  Negative for fever, chills, night sweats, anorexia, weight loss, pain. Cardiovascular: no chest pain or dyspnea on exertion Respiratory: no cough, shortness of breath, or wheezing Neurological: no TIA or stroke symptoms Dermatological: negative ENT: negative Skin Gastrointestinal: negative Genito-Urinary: negative Hematological and Lymphatic: negative Breast: negative Musculoskeletal: positive for - muscle pain and swelling in arm - right Remaining ROS negative.  Physical Exam: Blood pressure 137/94, pulse 73, temperature 98.3 F (36.8 C), resp. rate 20, height 5\' 4"  (1.626 m), weight 188 lb 12.8 oz (85.639 kg). ECOG: 0 General  appearance: alert, cooperative and appears stated age Head: Normocephalic, without obvious abnormality, atraumatic Neck: no adenopathy, no carotid bruit, no JVD, supple, symmetrical, trachea midline and thyroid not enlarged, symmetric, no tenderness/mass/nodules Lymph nodes: Cervical, supraclavicular, and axillary nodes normal. Cardiac : regular rate and rhythm, no murmurs or gallops Pulmonary:clear to auscultation bilaterally and normal percussion bilaterally Breasts: inspection negative, no nipple discharge or bleeding, no masses or nodularity palpable, both breasts are status post implant. There is no evidence of local recurrence tenderness or masses. Abdomen:soft, non-tender; bowel sounds normal; no masses,  no organomegaly Extremities her right extremity is somewhat swollen left. There is no tenderness along the arm. I cannot take any axillary adenopathy. Neuro: alert, oriented, normal speech, no focal findings or movement disorder noted  Lab Results: Lab Results  Component Value Date   WBC 5.6 06/16/2010   HGB 13.6 06/16/2010   HCT 39.6 06/16/2010   MCV 92.5 06/16/2010   PLT 281 06/16/2010     Chemistry      Component Value Date/Time   NA 140 06/16/2010 1333   K 3.8 06/16/2010 1333   CL 103 06/16/2010 1333   CO2 26 06/16/2010 1333   BUN 10 06/16/2010 1333   CREATININE 0.79 06/16/2010 1333      Component Value Date/Time   CALCIUM 10.0 06/16/2010 1333   ALKPHOS 77 06/16/2010 1333   AST 15 06/16/2010 1333   ALT 14 06/16/2010 1333   BILITOT 0.6 06/16/2010 1333      .pathology. Radiological Studies: chest X-ray n/a Mammogram n/a Bone density n/a  Impression and Plan: The patient is doing reasonably well but has developed lymphedema over the past few months. This is now better. His obvious evidence of recurrence. She is stable I plan to  see her in 6 months time. More than 50% of the visit was spent in patient-related counselling   Pierce Crane, MD 10/31/201310:34 AM

## 2012-07-31 NOTE — Telephone Encounter (Signed)
Mailed out calendar to inform the patient of the new date and time 

## 2012-10-03 ENCOUNTER — Other Ambulatory Visit: Payer: Self-pay | Admitting: Internal Medicine

## 2012-10-03 DIAGNOSIS — C73 Malignant neoplasm of thyroid gland: Secondary | ICD-10-CM

## 2012-11-05 ENCOUNTER — Ambulatory Visit
Admission: RE | Admit: 2012-11-05 | Discharge: 2012-11-05 | Disposition: A | Discharge status: Home / Self Care | Payer: BC Managed Care – PPO | Source: Ambulatory Visit | Attending: Internal Medicine | Admitting: Internal Medicine

## 2012-11-05 DIAGNOSIS — C73 Malignant neoplasm of thyroid gland: Secondary | ICD-10-CM

## 2012-11-15 ENCOUNTER — Other Ambulatory Visit: Payer: Self-pay

## 2012-12-03 ENCOUNTER — Telehealth: Payer: Self-pay | Admitting: *Deleted

## 2012-12-03 NOTE — Telephone Encounter (Signed)
Left message for a return phone call to reschedule her appt.  Awaiting patient response I have cancelled her appts.  

## 2012-12-11 ENCOUNTER — Telehealth: Payer: Self-pay | Admitting: *Deleted

## 2012-12-11 NOTE — Telephone Encounter (Signed)
Received a return phone call from patient to reschedule her appt.  Confirmed appt. For 01/30/13 at 0900 with Bernell List.  Then will become Dr. Welton Flakes per patient request.

## 2012-12-24 ENCOUNTER — Telehealth: Payer: Self-pay | Admitting: *Deleted

## 2012-12-24 NOTE — Telephone Encounter (Signed)
Received call from patient stating she needs to change her appt. And would like her lab orders faxed to [facility].  Confirmed appt. For 02/02/13  At 0900 with Bernell List. Faxed orders for labs to [phone].

## 2013-01-26 ENCOUNTER — Other Ambulatory Visit: Payer: BC Managed Care – PPO | Admitting: Lab

## 2013-01-30 ENCOUNTER — Ambulatory Visit: Payer: BC Managed Care – PPO | Admitting: Oncology

## 2013-01-30 ENCOUNTER — Encounter: Payer: Self-pay | Admitting: Medical Oncology

## 2013-01-30 ENCOUNTER — Ambulatory Visit: Payer: BC Managed Care – PPO | Admitting: Family

## 2013-02-02 ENCOUNTER — Ambulatory Visit (HOSPITAL_BASED_OUTPATIENT_CLINIC_OR_DEPARTMENT_OTHER): Payer: BC Managed Care – PPO | Admitting: Family

## 2013-02-02 ENCOUNTER — Encounter: Payer: Self-pay | Admitting: Family

## 2013-02-02 ENCOUNTER — Telehealth: Payer: Self-pay | Admitting: Oncology

## 2013-02-02 VITALS — BP 143/86 | HR 71 | Temp 97.9°F | Resp 20 | Ht 64.0 in | Wt 190.8 lb

## 2013-02-02 DIAGNOSIS — I890 Lymphedema, not elsewhere classified: Secondary | ICD-10-CM

## 2013-02-02 DIAGNOSIS — Z853 Personal history of malignant neoplasm of breast: Secondary | ICD-10-CM

## 2013-02-02 DIAGNOSIS — C50911 Malignant neoplasm of unspecified site of right female breast: Secondary | ICD-10-CM | POA: Insufficient documentation

## 2013-02-02 NOTE — Patient Instructions (Addendum)
Please contact us at [phone] if you have any questions or concerns.  Health Maintenance, Females  A healthy lifestyle and preventative care can promote health and wellness.  Maintain regular health, dental, and eye exams.  Eat a healthy diet. Foods like vegetables, fruits, whole grains, low-fat dairy products, and lean protein foods contain the nutrients you need without too many calories. Decrease your intake of foods high in solid fats, added sugars, and salt. Get information about a proper diet from your caregiver, if necessary.  Regular physical exercise is one of the most important things you can do for your health. Most adults should get at least 150 minutes of moderate-intensity exercise (any activity that increases your heart rate and causes you to sweat) each week. In addition, most adults need muscle-strengthening exercises on 2 or more days a week.  Maintain a healthy weight. The body mass index (BMI) is a screening tool to identify possible weight problems. It provides an estimate of body fat based on height and weight. Your caregiver can help determine your BMI, and can help you achieve or maintain a healthy weight. For adults 20 years and older:  A BMI below 18.5 is considered underweight.  A BMI of 18.5 to 24.9 is normal.  A BMI of 25 to 29.9 is considered overweight.  A BMI of 30 and above is considered obese.  Maintain normal blood lipids and cholesterol by exercising and minimizing your intake of saturated fat. Eat a balanced diet with plenty of fruits and vegetables. Blood tests for lipids and cholesterol should begin at age 20 and be repeated every 5 years. If your lipid or cholesterol levels are high, you are over 50, or you are a high risk for heart disease, you may need your cholesterol levels checked more frequently. Ongoing high lipid and cholesterol levels should be treated with medicines if diet and exercise are not effective.  If you smoke, find out from your caregiver  how to quit. If you do not use tobacco, do not start.  If you are pregnant, do not drink alcohol. If you are breastfeeding, be very cautious about drinking alcohol. If you are not pregnant and choose to drink alcohol, do not exceed 1 drink per day. One drink is considered to be 12 ounces (355 mL) of beer, 5 ounces (148 mL) of wine, or 1.5 ounces (44 mL) of liquor.  Avoid use of street drugs. Do not share needles with anyone. Ask for help if you need support or instructions about stopping the use of drugs.  High blood pressure causes heart disease and increases the risk of stroke. Blood pressure should be checked at least every 1 to 2 years. Ongoing high blood pressure should be treated with medicines, if weight loss and exercise are not effective.  If you are 55 to 37 years old, ask your caregiver if you should take aspirin to prevent strokes.  Diabetes screening involves taking a blood sample to check your fasting blood sugar level. This should be done once every 3 years, after age 45, if you are within normal weight and without risk factors for diabetes. Testing should be considered at a younger age or be carried out more frequently if you are overweight and have at least 1 risk factor for diabetes.  Breast cancer screening is essential preventative care for women. You should practice "breast self-awareness." This means understanding the normal appearance and feel of your breasts and may include breast self-examination. Any changes detected, no matter how   small, should be reported to a caregiver. Women in their 20s and 30s should have a clinical breast exam (CBE) by a caregiver as part of a regular health exam every 1 to 3 years. After age 40, women should have a CBE every year. Starting at age 40, women should consider having a mammogram (breast X-ray) every year. Women who have a family history of breast cancer should talk to their caregiver about genetic screening. Women at a high risk of breast cancer  should talk to their caregiver about having an MRI and a mammogram every year.  The Pap test is a screening test for cervical cancer. Women should have a Pap test starting at age 21. Between ages 21 and 29, Pap tests should be repeated every 2 years. Beginning at age 30, you should have a Pap test every 3 years as long as the past 3 Pap tests have been normal. If you had a hysterectomy for a problem that was not cancer or a condition that could lead to cancer, then you no longer need Pap tests. If you are between ages 65 and 70, and you have had normal Pap tests going back 10 years, you no longer need Pap tests. If you have had past treatment for cervical cancer or a condition that could lead to cancer, you need Pap tests and screening for cancer for at least 20 years after your treatment. If Pap tests have been discontinued, risk factors (such as a new sexual partner) need to be reassessed to determine if screening should be resumed. Some women have medical problems that increase the chance of getting cervical cancer. In these cases, your caregiver may recommend more frequent screening and Pap tests.  The human papillomavirus (HPV) test is an additional test that may be used for cervical cancer screening. The HPV test looks for the virus that can cause the cell changes on the cervix. The cells collected during the Pap test can be tested for HPV. The HPV test could be used to screen women aged 30 years and older, and should be used in women of any age who have unclear Pap test results. After the age of 30, women should have HPV testing at the same frequency as a Pap test.  Colorectal cancer can be detected and often prevented. Most routine colorectal cancer screening begins at the age of 50 and continues through age 75. However, your caregiver may recommend screening at an earlier age if you have risk factors for colon cancer. On a yearly basis, your caregiver may provide home test kits to check for hidden blood  in the stool. Use of a small camera at the end of a tube, to directly examine the colon (sigmoidoscopy or colonoscopy), can detect the earliest forms of colorectal cancer. Talk to your caregiver about this at age 50, when routine screening begins. Direct examination of the colon should be repeated every 5 to 10 years through age 75, unless early forms of pre-cancerous polyps or small growths are found.  Hepatitis C blood testing is recommended for all people born from 1945 through 1965 and any individual with known risks for hepatitis C.  Practice safe sex. Use condoms and avoid high-risk sexual practices to reduce the spread of sexually transmitted infections (STIs). Sexually active women aged 25 and younger should be checked for Chlamydia, which is a common sexually transmitted infection. Older women with new or multiple partners should also be tested for Chlamydia. Testing for other STIs is recommended if you   are sexually active and at increased risk.  Osteoporosis is a disease in which the bones lose minerals and strength with aging. This can result in serious bone fractures. The risk of osteoporosis can be identified using a bone density scan. Women ages 65 and over and women at risk for fractures or osteoporosis should discuss screening with their caregivers. Ask your caregiver whether you should be taking a calcium supplement or vitamin D to reduce the rate of osteoporosis.  Menopause can be associated with physical symptoms and risks. Hormone replacement therapy is available to decrease symptoms and risks. You should talk to your caregiver about whether hormone replacement therapy is right for you.  Use sunscreen with a sun protection factor (SPF) of 30 or greater. Apply sunscreen liberally and repeatedly throughout the day. You should seek shade when your shadow is shorter than you. Protect yourself by wearing long sleeves, pants, a wide-brimmed hat, and sunglasses year round, whenever you are  outdoors.  Notify your caregiver of new moles or changes in moles, especially if there is a change in shape or color. Also notify your caregiver if a mole is larger than the size of a pencil eraser.  Stay current with your immunizations.  Document Released: 04/02/2011 Document Revised: 12/10/2011 Document Reviewed: 04/02/2011 [employer] Patient Information 2013 [employer].  

## 2013-02-02 NOTE — Progress Notes (Signed)
[facility]  [phone] [phone]  OFFICE PROGRESS NOTE  PATIENT: Bonnie Harper   DOB: 1976/01/24  MR#: [id number]  JYN#:[id number]   QM:VHQIONGE,XBMWU DEAN, MD Harvette A. Corder, NP-C Janit Pagan, M.D. 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   PRIOR THERAPY: 1.  Status post right needle core biopsy on 02/28/2007 which showed invasive mammary carcinoma that appeared to be intermediate to high-grade invasive ductal carcinoma, ER 0%, PR 0%, Ki-67 59%, HER-2/neu negative.  2.  Bilateral breast MRI on 03/12/2007 which showed an enhancing solitary mass with washout rim enhancement at the 6 o'clock position of the right breast that measured 1.4 x 1.4 x 1.4 cm.  No other abnormality identified on the right.  Images on the left breast were unremarkable.  No enlarged internal mammary or axillary lymph nodes were identified.  (T1 NX).  3.  The patient had a 2-D echocardiogram on 03/26/2007 which showed an ejection fraction of 50%.  4.  The patient underwent neoadjuvant chemotherapy with dose dense FEC from 04/01/2007 through 05/22/2007 x 4 cycles with Neulasta support and dose dense Taxotere from 06/05/2007 through 07/03/2007 x 3 cycles with Neulasta support.  5.  The patient had genetic counseling and testing.  A [facility] report dated 04/23/2007 showed the patient tested negative for the BRCA1 and BRCA2 gene mutations.  6.  The patient underwent bilateral mastectomies with sentinel node biopsy and reconstruction surgery performed at the [facility] on 08/14/2007.  7.  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  CURRENT THERAPY: Breast MRI every 1-2 years and observation.   INTERVAL HISTORY: Dr. Welton Flakes and I  saw [name] See today for follow up of invasive ductal carcinoma of the right breast.  Her last office visit with Dr. Donnie Coffin was on 07/31/2012 .  Since her last office visit,  the patient reports that she still has right lymphedema, night sweats, vaginal dryness, and hot flashes.  She has received physical therapy for her right upper extremity lymphedema previously.  She is working with her gynecologist with the night sweats, vaginal dryness, and hot flashes.  The patient also reports having irregular menses.  The patient denies any other symptomatology.  The patient is establishing herself with Dr. Milta Deiters service today.  PAST MEDICAL HISTORY: Past Medical History  Diagnosis Date  . Breast cancer 02/28/2007    Right breast s/p bilat mastectomies  . Cancer 11/03/2009    Thyroid cancer s/p thyroidectomy   . Anxiety   . IBS (irritable bowel syndrome)   . Thyroid disease     PAST SURGICAL HISTORY: Past Surgical History  Procedure Laterality Date  . Cholecystectomy    . Mastectomy Bilateral 08/14/2007  . Port-a-cath removal  08/14/2007  . Breast reconstruction Bilateral   . Cesarean section      C-section x 2  . Thyroidectomy  11/03/2009    FAMILY HISTORY: Family History  Problem Relation Age of Onset  . Stroke Father 41  . Diabetes Father   . Asthma Father     SOCIAL HISTORY: History  Substance Use Topics  . Smoking status: Former Smoker    Quit date: 04/04/2002  . Smokeless tobacco: Never Used  . Alcohol Use: 1.2 oz/week    2 Glasses of wine per week    ALLERGIES: Allergies  Allergen Reactions  .  Zofran Other (See Comments)    migraines    MEDICATIONS:  Current Outpatient Prescriptions  Medication Sig Dispense Refill  . Calcium Citrate-Vitamin D (CITRACAL + D PO) Take 1 tablet by mouth daily.      . cetirizine (ZYRTEC) 10 MG tablet Take 10 mg by mouth daily.      . Cholecalciferol (VITAMIN D PO) Take 5,000 Units by mouth daily.      Marland Kitchen levothyroxine (SYNTHROID) 100  MCG tablet Take 100 mcg by mouth daily before breakfast.      . liothyronine (CYTOMEL) 25 MCG tablet Take 12.5 mcg by mouth daily.       Marland Kitchen LORazepam (ATIVAN) 1 MG tablet Take 1 mg by mouth every 8 (eight) hours.      . Multiple Vitamin (MULTIVITAMIN) tablet Take 1 tablet by mouth daily.       No current facility-administered medications for this visit.      REVIEW OF SYSTEMS: A 10 point review of systems was completed and is negative except as noted above.    PHYSICAL EXAMINATION: BP 143/86  Pulse 71  Temp(Src) 97.9 F (36.6 C) (Oral)  Resp 20  Ht 5\' 4"  (1.626 m)  Wt 190 lb 12.8 oz (86.546 kg)  BMI 32.73 kg/m2   General appearance: Alert, cooperative, well nourished, no apparent distress Head: Normocephalic, without obvious abnormality, atraumatic Eyes: Conjunctivae/corneas clear, PERRLA, EOMI Nose: Nares, septum and mucosa are normal, no drainage or sinus tenderness Neck: No adenopathy, supple, symmetrical, trachea midline, thyroid not enlarged, no tenderness,   well-healed surgical scar in thyroid area Resp: Clear to auscultation bilaterally Cardio: Regular rate and rhythm, S1, S2 normal, no murmur, click, rub or gallop Breasts:  Bilateral mastectomies with reconstruction and reconstructed nipples , well-healed surgical scars,  right upper extremity lymphedema,  right axillary fullness GI: Soft, distended, non-tender, hypoactive bowel sounds, no organomegaly Extremities: Extremities normal, atraumatic, no cyanosis, right upper extremity lymphedema  Lymph nodes: Cervical, supraclavicular, and axillary nodes normal Neurologic: Grossly normal    ECOG FS:  Grade 1 - Symptomatic but completely ambulatory           LAB RESULTS: Lab Results  Component Value Date   WBC 5.6 06/16/2010   NEUTROABS 3.4 06/16/2010   HGB 13.6 06/16/2010   HCT 39.6 06/16/2010   MCV 92.5 06/16/2010   PLT 281 06/16/2010      Chemistry      Component Value Date/Time   NA 140 06/16/2010 1333   K 3.8  06/16/2010 1333   CL 103 06/16/2010 1333   CO2 26 06/16/2010 1333   BUN 10 06/16/2010 1333   CREATININE 0.79 06/16/2010 1333      Component Value Date/Time   CALCIUM 10.0 06/16/2010 1333   ALKPHOS 77 06/16/2010 1333   AST 15 06/16/2010 1333   ALT 14 06/16/2010 1333   BILITOT 0.6 06/16/2010 1333       Lab Results  Component Value Date   LABCA2 18 06/16/2010      RADIOGRAPHIC STUDIES: No results found.   ASSESSMENT: 37 y.o.  [place], [place]: 1.  Status post right needle core biopsy on 02/28/2007 which showed invasive mammary carcinoma that appeared to be intermediate to high-grade invasive ductal carcinoma, ER 0%, PR 0%, Ki-67 59%, HER-2/neu negative.  2.  Bilateral breast MRI on 03/12/2007 which showed an enhancing solitary mass with washout rim enhancement at the 6 o'clock position of the right breast that measured 1.4 x 1.4 x 1.4 cm.  No  other abnormality identified on the right.  Images on the left breast were unremarkable.  No enlarged internal mammary or axillary lymph nodes were identified.  (T1 NX).  3.  The patient had a 2-D echocardiogram on 03/26/2007 which showed an ejection fraction of 50%.  4.  The patient underwent neoadjuvant chemotherapy with dose dense FEC from 04/01/2007 through 05/22/2007 x 4 cycles with Neulasta support and dose dense Taxotere from 06/05/2007 through 07/03/2007 x 3 cycles with Neulasta support.  5.  The patient had genetic counseling and testing.  A [facility] report dated 04/23/2007 showed the patient tested negative for the BRCA1 and BRCA2 gene mutations.  6.  The patient underwent bilateral mastectomies with sentinel node biopsy and reconstruction surgery performed at [facility] on 08/14/2007.  7.  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  8.  Right upper extremity lymphedema  9.  Hot flashes/night sweats/vaginal dryness  PLAN: 1.  We  plan to see the patient in one year and will obtain a bilateral breast MRI prior to her next office visit.  The patient states that she has her laboratories drawn at her primary care physician's office, and she have the results forwarded to [place].  The patient's last bilateral breast MRI on 02/21/2012 showed bilateral mastectomies with implant reconstruction without MRI specific evidence of malignancy.  No axillary or internal mammary adenopathy was identified.  2.  The patient was given a prescription for a compression sleeve and glove garments for her right upper extremity lymphedema.  3.  The patient is working with her gynecologist for hot flashes, night sweats, and vaginal dryness.  All questions were answered.  The patient was encouraged to contact us in the interim with any problems, questions or concerns.    Larina Bras, NP-C 02/03/2013, 7:33 PM

## 2013-02-16 ENCOUNTER — Other Ambulatory Visit (HOSPITAL_COMMUNITY)
Admission: RE | Admit: 2013-02-16 | Discharge: 2013-02-16 | Disposition: A | Discharge status: Home / Self Care | Payer: BC Managed Care – PPO | Source: Ambulatory Visit | Attending: Obstetrics and Gynecology | Admitting: Obstetrics and Gynecology

## 2013-02-16 ENCOUNTER — Other Ambulatory Visit: Payer: Self-pay | Admitting: Nurse Practitioner

## 2013-02-16 DIAGNOSIS — N760 Acute vaginitis: Secondary | ICD-10-CM | POA: Insufficient documentation

## 2013-02-16 DIAGNOSIS — Z01419 Encounter for gynecological examination (general) (routine) without abnormal findings: Secondary | ICD-10-CM | POA: Insufficient documentation

## 2013-02-16 DIAGNOSIS — Z1151 Encounter for screening for human papillomavirus (HPV): Secondary | ICD-10-CM | POA: Insufficient documentation

## 2013-08-06 ENCOUNTER — Other Ambulatory Visit: Payer: Self-pay

## 2013-08-28 ENCOUNTER — Telehealth: Payer: Self-pay | Admitting: *Deleted

## 2013-08-28 NOTE — Telephone Encounter (Signed)
Lm informed the pt that KK will be in [facility] during the am on 02/03/2014. gv appt for 02/03/14 @ 1:30pm. Made the pt aware that i will mail a letter/avs..td

## 2013-12-02 ENCOUNTER — Other Ambulatory Visit: Payer: Self-pay | Admitting: Internal Medicine

## 2013-12-02 DIAGNOSIS — C73 Malignant neoplasm of thyroid gland: Secondary | ICD-10-CM

## 2013-12-07 ENCOUNTER — Ambulatory Visit
Admission: RE | Admit: 2013-12-07 | Discharge: 2013-12-07 | Disposition: A | Discharge status: Home / Self Care | Payer: BC Managed Care – PPO | Source: Ambulatory Visit | Attending: Internal Medicine | Admitting: Internal Medicine

## 2013-12-07 DIAGNOSIS — C73 Malignant neoplasm of thyroid gland: Secondary | ICD-10-CM

## 2013-12-11 ENCOUNTER — Encounter: Payer: Self-pay | Admitting: Family

## 2013-12-14 ENCOUNTER — Other Ambulatory Visit: Payer: Self-pay

## 2013-12-14 DIAGNOSIS — C50911 Malignant neoplasm of unspecified site of right female breast: Secondary | ICD-10-CM

## 2013-12-14 NOTE — Progress Notes (Signed)
Pt contacted triage wanting to move labs to [facility].  Per KK - ok.  Lab orders placed and pof sent.

## 2013-12-15 ENCOUNTER — Telehealth: Payer: Self-pay | Admitting: Oncology

## 2013-12-15 NOTE — Telephone Encounter (Signed)
s.w. pt and advised on lab added to visit on 5.6.15 appt....pt ok and aware

## 2013-12-31 ENCOUNTER — Telehealth: Payer: Self-pay

## 2014-01-01 ENCOUNTER — Encounter: Payer: Self-pay | Admitting: Oncology

## 2014-01-01 ENCOUNTER — Other Ambulatory Visit: Payer: Self-pay | Admitting: Family

## 2014-01-01 DIAGNOSIS — C50919 Malignant neoplasm of unspecified site of unspecified female breast: Secondary | ICD-10-CM

## 2014-01-01 NOTE — Telephone Encounter (Signed)
THIS MY CHART MESSAGE WAS PLACED ON THE DESK OF LINDA DAVIS.

## 2014-01-01 NOTE — Telephone Encounter (Signed)
Charting error.

## 2014-01-04 ENCOUNTER — Other Ambulatory Visit: Payer: Self-pay | Admitting: Oncology

## 2014-01-04 ENCOUNTER — Ambulatory Visit (HOSPITAL_COMMUNITY): Admission: RE | Admit: 2014-01-04 | Payer: Managed Care, Other (non HMO) | Source: Ambulatory Visit

## 2014-01-05 ENCOUNTER — Telehealth: Payer: Self-pay

## 2014-01-05 NOTE — Telephone Encounter (Signed)
Prescription for labs faxed to [facility].  Patient needs to make appointment for lab draw 3-5 days prior to her MD appointment on 5/6.

## 2014-01-05 NOTE — Telephone Encounter (Signed)
Pt requested labs to be drawn at [place] to return call.

## 2014-01-06 NOTE — Telephone Encounter (Signed)
Called patient after reading today's MyChart request.  Informed her to have [facility] at [facility] the last week of April in order for results to be here for f/u visit on 02-03-2014.  In reference to the MRI, informed pt. what Benedetto Goad with [facility] learned about her policy.  No MRI is allowed for patients s/p mastectomy and will have to pay for MRI out of pocket.  Bonnie Harper reports she has "called [facility] and was informed an MRI is allowed every two years and her last MRI was in 2013.   My new insurance [employer] needs medical necessity info to approve service.  I have had implants and with my medical history the MRI's are being ordered."   Patient is aware Dr. Humphrey Rolls is out of the office.  This message will be left for Dr. Humphrey Rolls upon her return.

## 2014-01-07 NOTE — Telephone Encounter (Signed)
I spoke with patient regarding labs to be drawn at [place]. Patient would like to take the prescription with her in case [facility] doesn't have it. (Prescription was faxed.) Prescription was placed in an envelope and given to [facility]. Patient to schedule labs at least 3-5 days prior to MD/NP visit so, we will have lab results with visit. Patient verbalized understanding.

## 2014-01-12 NOTE — Telephone Encounter (Signed)
Please let patient know that we will discuss this MRI issue further on her appointment in May.

## 2014-01-13 NOTE — Telephone Encounter (Signed)
Provided office note dtd 02/03/13 to Benedetto Goad.

## 2014-01-27 ENCOUNTER — Telehealth: Payer: Self-pay | Admitting: Oncology

## 2014-01-27 NOTE — Telephone Encounter (Signed)
kk out pt to see cp2 6/1. lmonvm informing pt and mailed schedule.

## 2014-02-03 ENCOUNTER — Ambulatory Visit: Payer: BC Managed Care – PPO | Admitting: Oncology

## 2014-02-03 ENCOUNTER — Other Ambulatory Visit: Payer: BC Managed Care – PPO

## 2014-02-09 ENCOUNTER — Encounter: Payer: Self-pay | Admitting: Oncology

## 2014-02-26 ENCOUNTER — Telehealth: Payer: Self-pay | Admitting: Oncology

## 2014-02-26 NOTE — Telephone Encounter (Signed)
, °

## 2014-03-01 ENCOUNTER — Other Ambulatory Visit: Payer: BC Managed Care – PPO

## 2014-03-01 ENCOUNTER — Encounter: Payer: Self-pay | Admitting: Oncology

## 2014-03-01 ENCOUNTER — Telehealth: Payer: Self-pay | Admitting: *Deleted

## 2014-03-01 ENCOUNTER — Ambulatory Visit (HOSPITAL_BASED_OUTPATIENT_CLINIC_OR_DEPARTMENT_OTHER): Payer: Managed Care, Other (non HMO) | Admitting: Internal Medicine

## 2014-03-01 VITALS — BP 148/89 | HR 94 | Temp 98.3°F | Resp 20 | Ht 64.0 in | Wt 195.0 lb

## 2014-03-01 DIAGNOSIS — C50911 Malignant neoplasm of unspecified site of right female breast: Secondary | ICD-10-CM

## 2014-03-01 DIAGNOSIS — Z803 Family history of malignant neoplasm of breast: Secondary | ICD-10-CM

## 2014-03-01 DIAGNOSIS — C73 Malignant neoplasm of thyroid gland: Secondary | ICD-10-CM

## 2014-03-01 DIAGNOSIS — Z8041 Family history of malignant neoplasm of ovary: Secondary | ICD-10-CM

## 2014-03-01 DIAGNOSIS — Z853 Personal history of malignant neoplasm of breast: Secondary | ICD-10-CM

## 2014-03-01 DIAGNOSIS — N951 Menopausal and female climacteric states: Secondary | ICD-10-CM

## 2014-03-01 DIAGNOSIS — R61 Generalized hyperhidrosis: Secondary | ICD-10-CM

## 2014-03-01 DIAGNOSIS — I890 Lymphedema, not elsewhere classified: Secondary | ICD-10-CM

## 2014-03-01 NOTE — Telephone Encounter (Signed)
Received request from Dr. Terrill Mohr for genetics to be scheduled for Bonnie Harper.  Called Bonnie Harper and confirmed 6.3.15 genetic appt w/ her.

## 2014-03-01 NOTE — Progress Notes (Signed)
[place]  Telephone:(336) [phone] Fax:(336) [phone]  OFFICE PROGRESS NOTE  PATIENT: Bonnie Harper   DOB: [birth year]  MR#: [id number]  ZHG#:[id number]   HD:[id number], MD Harvette A. Corder, NP-C Carman Ching, M.D. 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  PRIOR THERAPY: 1.  Status post right needle core biopsy on 02/28/2007 which showed invasive mammary carcinoma that appeared to be intermediate to high-grade invasive ductal carcinoma, ER 0%, PR 0%, Ki-67 59%, HER-2/neu negative.  2.  Bilateral breast MRI on 03/12/2007 which showed an enhancing solitary mass with washout rim enhancement at the 6 o'clock position of the right breast that measured 1.4 x 1.4 x 1.4 cm.  No other abnormality identified on the right.  Images on the left breast were unremarkable.  No enlarged internal mammary or axillary lymph nodes were identified.  (T1 NX).  3.  The patient had a 2-D echocardiogram on 03/26/2007 which showed an ejection fraction of 50%.  4.  The patient underwent neoadjuvant chemotherapy with dose dense FEC from 04/01/2007 through 05/22/2007 x 4 cycles with Neulasta support and dose dense Taxotere from 06/05/2007 through 07/03/2007 x 3 cycles with Neulasta support.  5.  The patient had genetic counseling and testing.  A BRCAnalysis report dated 04/23/2007 showed the patient tested negative for the BRCA1 and BRCA2 gene mutations.  6.  The patient underwent bilateral mastectomies with sentinel node biopsy and reconstruction surgery performed at the [facility] on 08/14/2007.  7.  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  CURRENT THERAPY: Breast MRI every 1-2 years and  observation.   INTERVAL HISTORY: She feels well. She has no complaints of new bony aches or pains. She's been been well. Denies any problems with bowel movement or urine function. She denies any palpable suspicious lesions on her breasts she is status post reconstructive surgery with silicon implants. She complains of some mild discomfort along the surgical sites for her breast reconstruction, but that has been long-standing and is not new.  She also is perimenopausal at this time. She has complaints of hot flashes. She has had 2 children and does not plan to have more. She follows up with her gynecologist on a regular basis for ovarian cysts.  BRCA analysis was performed back in 2008 and it was negative. She has a significant Emily history of notable grand aunts with ovarian cancer. She has a grandmother who had breast cancer at age 40. Her father had prostate cancer. Patient also has a diagnosis of thyroid follicular cancer.  PAST MEDICAL HISTORY: Past Medical History  Diagnosis Date  . Breast cancer 02/28/2007    Right breast s/p bilat mastectomies  . Cancer 11/03/2009    Thyroid cancer s/p thyroidectomy   . Anxiety   . IBS (irritable bowel syndrome)   . Thyroid disease     PAST SURGICAL HISTORY: Past Surgical History  Procedure Laterality Date  . Cholecystectomy    . Mastectomy Bilateral 08/14/2007  . Port-a-cath removal  08/14/2007  . Breast reconstruction Bilateral   . Cesarean section      C-section x 2  . Thyroidectomy  11/03/2009    FAMILY HISTORY: Family History  Problem Relation Age of Onset  . Stroke Father 50  . Diabetes Father   . Asthma Father  SOCIAL HISTORY: History  Substance Use Topics  . Smoking status: Former Smoker    Quit date: 04/04/2002  . Smokeless tobacco: Never Used  . Alcohol Use: 1.2 oz/week    2 Glasses of wine per week    ALLERGIES: Allergies  Allergen Reactions  . Zofran Other (See Comments)    migraines     MEDICATIONS:  Current Outpatient Prescriptions  Medication Sig Dispense Refill  . Calcium Citrate-Vitamin D (CITRACAL + D PO) Take 1 tablet by mouth daily.      . cetirizine (ZYRTEC) 10 MG tablet Take 10 mg by mouth daily.      . Cholecalciferol (VITAMIN D PO) Take 5,000 Units by mouth daily.      . cholestyramine (QUESTRAN) 4 G packet Take 4 g by mouth daily.      Marland Kitchen levothyroxine (SYNTHROID) 100 MCG tablet Take 100 mcg by mouth daily before breakfast.      . liothyronine (CYTOMEL) 25 MCG tablet Take 12.5 mcg by mouth daily.       Marland Kitchen LORazepam (ATIVAN) 1 MG tablet Take 1 mg by mouth every 8 (eight) hours.      . Multiple Vitamin (MULTIVITAMIN) tablet Take 1 tablet by mouth daily.       No current facility-administered medications for this visit.    REVIEW OF SYSTEMS: A 10 point review of systems was completed and is negative except as noted above.    PHYSICAL EXAMINATION: BP 148/89  Pulse 94  Temp(Src) 98.3 F (36.8 C) (Oral)  Resp 20  Ht 5' 4"  (1.626 m)  Wt 195 lb (88.451 kg)  BMI 33.46 kg/m2  General: well-nourished in no acute distress. Alert and oriented X 3 HEENT: PERRLA. Anicteric sclerae.  Mucous membranes moist Lymph nodes: no adenopathy appreciated within cervical, supraclavicular, infraclavicular or axillary regions.  Lungs: Clear to ausculatation throughout. No rales or wheezing. Cardiovascular: regular, rate, rhythm. S1, S2. No jugular vein distension. Positive peripheral pulses. Breast:  Status post bilateral mastectomies with silicone implants. Surgical scars well-healed. No palpable masses bilaterally. Abdomen:  Positive bowel sounds. No hepatospenomegaly. Non-tender. Non-distended Groin: No adenopathy Extremities: No swelling bilaterally Cranial nerves: no gross focal deficits Skin: unremarkable  ECOG FS:  Grade 1 - Symptomatic but completely ambulatory         LAB RESULTS: Labs done outside dated 01/22/2014 show a hemoglobin of 13.9 with an MCV of 92.9.  White count is at 5.3 with normal differential. Electrolyte panel unremarkable with a creatinine at 0.67, normal calcium level and normal liver function tests   RADIOGRAPHIC STUDIES: No results found.   IMPRESSION/REPORT/PLAN: 38 y.o.  [place], [place] woman: 1.  Status post right needle core biopsy on 02/28/2007 which showed invasive mammary carcinoma that appeared to be intermediate to high-grade invasive ductal carcinoma, ER 0%, PR 0%, Ki-67 59%, HER-2/neu negative.  2.  Bilateral breast MRI on 03/12/2007 which showed an enhancing solitary mass with washout rim enhancement at the 6 o'clock position of the right breast that measured 1.4 x 1.4 x 1.4 cm.  No other abnormality identified on the right.  Images on the left breast were unremarkable.  No enlarged internal mammary or axillary lymph nodes were identified.  (T1 NX).  3.  The patient had a 2-D echocardiogram on 03/26/2007 which showed an ejection fraction of 50%.  4.  The patient underwent neoadjuvant chemotherapy with dose dense FEC from 04/01/2007 through 05/22/2007 x 4 cycles with Neulasta support and dose dense Taxotere from 06/05/2007 through 07/03/2007 x  3 cycles with Neulasta support.  5.  The patient had genetic counseling and testing.  A BRCAnalysis report dated 04/23/2007 showed the patient tested negative for the BRCA1 and BRCA2 gene mutations.  6.  The patient underwent bilateral mastectomies with sentinel node biopsy and reconstruction surgery performed at [facility] on 08/14/2007.  7.  Status post thyroidectomy on 11/03/2009 for multifocal follicular variant papillary thyroid carcinoma-in remission.  8.  Mild to minimal right upper extremity lymphedema-has lymphedema sleeve.  9.  Hot flashes-minimal and tolerable  PLAN: She was diagnosed with breast cancer at an extremely young age of 65. She had a BRCA testing done in 2008. They are now new mutations known within the BRCA gene  since then that come for increased breast cancer risk. In addition, she should also be evaluated for other familial hereditary cancers such as Maylon Peppers syndrome among others. She has 4 grandaunts with ovarian cancer and a grandmother with breast cancer. Father had prostate cancer. I recommend that she see genetic counseling for further assessment. She agrees to genetic counseling.  Recommend followup in 6 months, and review her genetic results. If she has a familial cancer syndrome, she may be eligible for breast screening MRIs per her insurance.  Multiple questions answered  Total time 28 minutes with more to 50% spent in counseling.  Dr. Doristine Church  03/01/2014, 1:28 PM

## 2014-03-02 ENCOUNTER — Telehealth: Payer: Self-pay | Admitting: Oncology

## 2014-03-02 NOTE — Telephone Encounter (Signed)
lvm for pt regarding to Dec appt...maield pt appt sched/avs and letter

## 2014-03-03 ENCOUNTER — Other Ambulatory Visit: Payer: Managed Care, Other (non HMO)

## 2014-03-03 ENCOUNTER — Ambulatory Visit (HOSPITAL_BASED_OUTPATIENT_CLINIC_OR_DEPARTMENT_OTHER): Payer: Managed Care, Other (non HMO) | Admitting: Genetic Counselor

## 2014-03-03 DIAGNOSIS — Z803 Family history of malignant neoplasm of breast: Secondary | ICD-10-CM | POA: Insufficient documentation

## 2014-03-03 DIAGNOSIS — C73 Malignant neoplasm of thyroid gland: Secondary | ICD-10-CM

## 2014-03-03 DIAGNOSIS — Z8041 Family history of malignant neoplasm of ovary: Secondary | ICD-10-CM

## 2014-03-03 DIAGNOSIS — C773 Secondary and unspecified malignant neoplasm of axilla and upper limb lymph nodes: Secondary | ICD-10-CM

## 2014-03-03 DIAGNOSIS — Z853 Personal history of malignant neoplasm of breast: Secondary | ICD-10-CM

## 2014-03-03 DIAGNOSIS — C50911 Malignant neoplasm of unspecified site of right female breast: Secondary | ICD-10-CM

## 2014-03-03 DIAGNOSIS — C50919 Malignant neoplasm of unspecified site of unspecified female breast: Secondary | ICD-10-CM | POA: Insufficient documentation

## 2014-03-03 DIAGNOSIS — IMO0002 Reserved for concepts with insufficient information to code with codable children: Secondary | ICD-10-CM

## 2014-03-03 NOTE — Progress Notes (Signed)
HISTORY OF PRESENT ILLNESS: Dr. Alroy Dust requested a cancer genetics consultation for Bonnie Harper, a 38 y.o. female, due to a personal and family history of cancer.  Bonnie Harper presents to clinic today to discuss the possibility of a hereditary predisposition to cancer, genetic testing, and to further clarify her future cancer risks, as well as potential cancer risk for family members.   Past Medical History  Diagnosis Date   Breast cancer 02/28/2007    Right breast s/p bilat mastectomies   Cancer 11/03/2009    Thyroid cancer s/p thyroidectomy    Anxiety    IBS (irritable bowel syndrome)    Thyroid disease     Past Surgical History  Procedure Laterality Date   Cholecystectomy     Mastectomy Bilateral 08/14/2007   Port-a-cath removal  08/14/2007   Breast reconstruction Bilateral    Cesarean section      C-section x 2   Thyroidectomy  11/03/2009    History   Social History   Marital Status: Married    Spouse Name: N/A    Number of Children: N/A   Years of Education: N/A   Social History Main Topics   Smoking status: Former Smoker    Quit date: 04/04/2002   Smokeless tobacco: Never Used   Alcohol Use: 1.2 oz/week    2 Glasses of wine per week   Drug Use: No   Sexual Activity: Yes    Birth Control/ Protection: Other-see comments     Comment: Husband is s/p vasectomy   Other Topics Concern   Not on file   Social History Narrative   No narrative on file     FAMILY HISTORY:  During the visit, a 4-generation pedigree was obtained. Significant diagnoses include the following:  Family History  Problem Relation Age of Onset   Stroke Father 20   Diabetes Father    Asthma Father    Cancer Maternal Uncle 4    melanoma   Cancer Maternal Grandmother 46    bilateral breast cancer   Cancer Other 46    mat great aunt through [facility] with breast cancer   Cancer Other 87    maternal great aunt through [facility] with breast cancer at age 73, ovarian  ca at 25 and 2nd breast primary at 28    Bonnie Harper's ancestry is of Caucasian descent. There is no known Jewish ancestry or consanguinity.  GENETIC COUNSELING ASSESSMENT: Bonnie Harper is a 38 y.o. female with a personal and family history of cancer highly suggestive of a hereditary predisposition to cancer. We, therefore, discussed and recommended the following at today's visit.   DISCUSSION: We reviewed the characteristics, features and inheritance patterns of hereditary cancer syndromes. We also discussed current genetic testing (up to date BRCA testing with rearrangement analysis and gene panels), including the appropriate family members to test, the process of testing, insurance coverage and turn-around-time for results. We discussed the implications of a negative, positive and/or variant of uncertain significant result. We recommended Bonnie Harper pursue genetic testing for the BreastNext gene panel at [facility].   PLAN: Based on our above recommendation, Bonnie Harper wished to pursue genetic testing and the blood sample was drawn and will be sent to [facility] for analysis of the BreastNext gene panel. Results should be available within approximately 6 weeks time, at which point they will be disclosed by telephone to Bonnie Harper, as will any additional recommendations warranted by these results. We encouraged Bonnie Harper to remain in contact  with cancer genetics annually so that we can continuously update the family history and inform her of any changes in cancer genetics and testing that may be of benefit for this family. Ms.  Harper questions were answered to her satisfaction today. Our contact information was provided should additional questions or concerns arise.   Thank you for the referral and allowing [place] to share in the care of your patient.   The patient was seen for a total of 45 minutes, greater than 50% of which was spent face-to-face counseling.  This patient was discussed  with Magrinat who agrees with the above.    _______________________________________________________________________ For Office Staff:  Number of people involved in session: 3 Was an Intern/ student involved with case: no

## 2014-04-07 ENCOUNTER — Encounter: Payer: Self-pay | Admitting: Genetic Counselor

## 2014-04-07 DIAGNOSIS — C50919 Malignant neoplasm of unspecified site of unspecified female breast: Secondary | ICD-10-CM

## 2014-04-07 DIAGNOSIS — C73 Malignant neoplasm of thyroid gland: Secondary | ICD-10-CM

## 2014-04-07 DIAGNOSIS — Z803 Family history of malignant neoplasm of breast: Secondary | ICD-10-CM

## 2014-04-07 NOTE — Progress Notes (Signed)
HPI:  Ms. Bruni was previously seen in the [facility] due to a personal and family history of cancer and concerns regarding a hereditary predisposition to cancer. Please refer to our prior cancer genetics clinic note for more information regarding Ms. Bartram's medical, social and family histories, and our assessment and recommendations, at the time. Ms. Peale recent genetic test results were disclosed to her, as were recommendations warranted by these results. These results and recommendations are discussed in more detail below.  GENETIC TEST RESULTS: At the time of Ms. Brockman's visit, we recommended she pursue genetic testing of the BreastNext gene panel. This test, which included sequencing and deletion/duplication analysis of the genes listed on the test report, was performed at [facility]. Genetic testing was normal, and did not reveal a mutation in any of the genes on the panel. A complete list of all genes tested is located on the test report scanned into [employer].    We discussed with Ms. Cressler that since the current genetic testing is not perfect, it is possible there may be a gene mutation in one of these genes that current testing cannot detect, but that chance is small.  We also discussed, that it is possible that another gene that has not yet been discovered, or that we have not yet tested, is responsible for the cancer diagnoses in the family, and it is, therefore, important to remain in touch with cancer genetics in the future so that we can continue to offer Ms. Broz the most up to date genetic testing.   CANCER SCREENING RECOMMENDATIONS: Given the personal and family histories, we must interpret these negative results with some caution.  Families with features suggestive of hereditary risk tend to have multiple family members with cancer, diagnoses in multiple generations and diagnoses before the age of 70. Ms. Considine's family exhibits some of these  features. Thus, this result may simply reflect our current inability to detect all mutations within these genes, or as mentioned above, may be a different gene that has not yet been discovered or tested. Ms. Mariscal has had a bilateral mastectomy and thus has undergone the most effective option to reduce her risk of a future breast cancer. We recommend she continue to follow the cancer management and screening guidelines provider by her primary and oncology providers.  RECOMMENDATIONS FOR FAMILY MEMBERS:  Given the family history, Ms. Narain female relatives may also be at increased risk for cancer over the general population.  Female relatives in this family should consider being followed at a high-risk breast clinic and have a yearly clinical breast exam, mammogram beginning at age 43, and perform monthly breast self-exams.These relatives should discuss the utility of breast MRI screening with their own healthcare provider, and also discuss with their gynecologists ovarian cancer screening and surgical options available. Women in this family should also have a gynecological exam as recommended by their primary provider. All family members should have a colonoscopy by age 65.  FOLLOW-UP: Lastly, we discussed with Ms. Hise that cancer genetics is a rapidly advancing field and it is possible that new genetic tests will be appropriate for her and/or her family members in the future. We encouraged her to remain in contact with cancer genetics on an annual basis so we can update her personal and family histories and let her know of advances in cancer genetics that may benefit this family.   Our contact number was provided. Ms. Kemp questions were answered to her satisfaction,  and she knows she is welcome to call us at anytime with additional questions or concerns. This patient was discussed with Dr. Jana Hakim who agrees with the above.   Catherine A. Fine, MS, CGC Certified Genetic Counseor phone:  [phone] [email or web address]

## 2014-07-16 ENCOUNTER — Other Ambulatory Visit: Payer: Self-pay

## 2014-08-18 ENCOUNTER — Telehealth: Payer: Self-pay | Admitting: Hematology

## 2014-08-18 NOTE — Telephone Encounter (Signed)
, °

## 2014-09-01 ENCOUNTER — Telehealth: Payer: Self-pay | Admitting: Hematology

## 2014-09-01 ENCOUNTER — Other Ambulatory Visit: Payer: Managed Care, Other (non HMO)

## 2014-09-01 ENCOUNTER — Other Ambulatory Visit: Payer: Self-pay | Admitting: *Deleted

## 2014-09-01 ENCOUNTER — Ambulatory Visit (HOSPITAL_BASED_OUTPATIENT_CLINIC_OR_DEPARTMENT_OTHER): Payer: Managed Care, Other (non HMO)

## 2014-09-01 ENCOUNTER — Ambulatory Visit: Payer: Managed Care, Other (non HMO) | Admitting: Adult Health

## 2014-09-01 ENCOUNTER — Ambulatory Visit (HOSPITAL_BASED_OUTPATIENT_CLINIC_OR_DEPARTMENT_OTHER): Payer: Managed Care, Other (non HMO) | Admitting: Hematology

## 2014-09-01 VITALS — BP 157/91 | HR 88 | Temp 97.8°F | Resp 18 | Ht 64.0 in | Wt 188.5 lb

## 2014-09-01 DIAGNOSIS — C50911 Malignant neoplasm of unspecified site of right female breast: Secondary | ICD-10-CM

## 2014-09-01 DIAGNOSIS — Z853 Personal history of malignant neoplasm of breast: Secondary | ICD-10-CM

## 2014-09-01 LAB — CBC WITH DIFFERENTIAL/PLATELET
BASO%: 0.9 % (ref 0.0–2.0)
Basophils Absolute: 0 10*3/uL (ref 0.0–0.1)
EOS%: 1.6 % (ref 0.0–7.0)
Eosinophils Absolute: 0.1 10*3/uL (ref 0.0–0.5)
HCT: 39.7 % (ref 34.8–46.6)
HGB: 13.2 g/dL (ref 11.6–15.9)
LYMPH%: 21.2 % (ref 14.0–49.7)
MCH: 31.4 pg (ref 25.1–34.0)
MCHC: 33.4 g/dL (ref 31.5–36.0)
MCV: 94.1 fL (ref 79.5–101.0)
MONO#: 0.6 10*3/uL (ref 0.1–0.9)
MONO%: 12.2 % (ref 0.0–14.0)
NEUT#: 3.4 10*3/uL (ref 1.5–6.5)
NEUT%: 64.1 % (ref 38.4–76.8)
Platelets: 250 10*3/uL (ref 145–400)
RBC: 4.22 10*6/uL (ref 3.70–5.45)
RDW: 12.1 % (ref 11.2–14.5)
WBC: 5.2 10*3/uL (ref 3.9–10.3)
lymph#: 1.1 10*3/uL (ref 0.9–3.3)

## 2014-09-01 LAB — COMPREHENSIVE METABOLIC PANEL (CC13)
ALT: 27 U/L (ref 0–55)
AST: 20 U/L (ref 5–34)
Albumin: 3.9 g/dL (ref 3.5–5.0)
Alkaline Phosphatase: 78 U/L (ref 40–150)
Anion Gap: 8 mEq/L (ref 3–11)
BUN: 7.2 mg/dL (ref 7.0–26.0)
CO2: 24 mEq/L (ref 22–29)
Calcium: 9.2 mg/dL (ref 8.4–10.4)
Chloride: 108 mEq/L (ref 98–109)
Creatinine: 0.7 mg/dL (ref 0.6–1.1)
Glucose: 91 mg/dl (ref 70–140)
Potassium: 3.6 mEq/L (ref 3.5–5.1)
Sodium: 140 mEq/L (ref 136–145)
Total Bilirubin: 0.53 mg/dL (ref 0.20–1.20)
Total Protein: 6.8 g/dL (ref 6.4–8.3)

## 2014-09-01 NOTE — Progress Notes (Signed)
[facility]  Telephone:(336) [phone] Fax:(336) [phone]  OFFICE PROGRESS NOTE  PATIENT: Bonnie Harper   DOB: Jan 05, 1976  MR#: [id number]  NKN#:[id number]   FX:[id number], MD Harvette A. Corder, NP-C Carman Ching, M.D. 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  Status post right needle core biopsy on 02/28/2007 which showed invasive mammary carcinoma that appeared to be intermediate to high-grade invasive ductal carcinoma, ER 0%, PR 0%, Ki-67 59%, HER-2/neu negative.  2.  Bilateral breast MRI on 03/12/2007 which showed an enhancing solitary mass with washout rim enhancement at the 6 o'clock position of the right breast that measured 1.4 x 1.4 x 1.4 cm.  No other abnormality identified on the right.  Images on the left breast were unremarkable.  No enlarged internal mammary or axillary lymph nodes were identified.  (T1 NX).  3.  The patient had a 2-D echocardiogram on 03/26/2007 which showed an ejection fraction of 50%.  4.  The patient underwent neoadjuvant chemotherapy with dose dense FEC from 04/01/2007 through 05/22/2007 x 4 cycles with Neulasta support and dose dense Taxotere from 06/05/2007 through 07/03/2007 x 3 cycles with Neulasta support.  5.  The patient had genetic counseling and testing.  A BRCAnalysis report dated 04/23/2007 showed the patient tested negative for the BRCA1 and BRCA2 gene mutations.  6.  The patient underwent bilateral mastectomies with sentinel node biopsy and reconstruction surgery performed at the [facility] on 08/14/2007.  7.  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  CURRENT THERAPY: Surveillance (Breast MRI every 1-2 years and follow up).   INTERVAL  HISTORY: Bonnie Harper returns for follow-up. She feels well overall, no new complaints. She has chronic intermittent chest pain, shotting, happens 1-2/weeks, she takes aleve as needed. She otherwise denies any other pain, cough, dyspnea, GI or any other symptoms. She has good appetite and energy level.  PAST MEDICAL HISTORY: Past Medical History  Diagnosis Date  . Breast cancer 02/28/2007    Right breast s/p bilat mastectomies  . Cancer 11/03/2009    Thyroid cancer s/p thyroidectomy   . Anxiety   . IBS (irritable bowel syndrome)   . Thyroid disease     PAST SURGICAL HISTORY: Past Surgical History  Procedure Laterality Date  . Cholecystectomy    . Mastectomy Bilateral 08/14/2007  . Port-a-cath removal  08/14/2007  . Breast reconstruction Bilateral   . Cesarean section      C-section x 2  . Thyroidectomy  11/03/2009    FAMILY HISTORY: Family History  Problem Relation Age of Onset  . Stroke Father 60  . Diabetes Father   . Asthma Father   . Cancer Maternal Uncle 64    melanoma  . Cancer Maternal Grandmother 85    bilateral breast cancer  . Cancer Other 36    mat great aunt through [facility] with breast cancer  . Cancer Other 20    maternal great aunt through [facility] with breast cancer at age 55, ovarian ca at 21 and 2nd breast primary at 68    SOCIAL HISTORY: History  Substance Use Topics  . Smoking status: Former Smoker    Quit date: 04/04/2002  . Smokeless tobacco: Never Used  . Alcohol Use: 1.2 oz/week    2 Glasses of wine per week  ALLERGIES: Allergies  Allergen Reactions  . Zofran Other (See Comments)    migraines    MEDICATIONS:  Current Outpatient Prescriptions  Medication Sig Dispense Refill  . Calcium Citrate-Vitamin D (CITRACAL + D PO) Take 1 tablet by mouth daily.    . cetirizine (ZYRTEC) 10 MG tablet Take 10 mg by mouth daily.    . Cholecalciferol (VITAMIN D PO) Take 5,000 Units by mouth daily.    . cholestyramine (QUESTRAN) 4 G packet Take 4 g by mouth  daily.    Marland Kitchen levothyroxine (SYNTHROID) 100 MCG tablet Take 100 mcg by mouth daily before breakfast.    . liothyronine (CYTOMEL) 25 MCG tablet Take 12.5 mcg by mouth daily.     Marland Kitchen LORazepam (ATIVAN) 1 MG tablet Take 1 mg by mouth every 8 (eight) hours.    . Multiple Vitamin (MULTIVITAMIN) tablet Take 1 tablet by mouth daily.     No current facility-administered medications for this visit.    GYN HISTORY: G2P2, son 39 and daughter 44   REVIEW OF SYSTEMS: A 10 point review of systems was completed and is negative except as noted above.    PHYSICAL EXAMINATION: BP 157/91 mmHg  Pulse 88  Temp(Src) 97.8 F (36.6 C) (Oral)  Resp 18  Ht 5' 4"  (1.626 m)  Wt 188 lb 8 oz (85.503 kg)  BMI 32.34 kg/m2   General appearance: Alert, cooperative, well nourished, no apparent distress Head: Normocephalic, without obvious abnormality, atraumatic Eyes: Conjunctivae/corneas clear, PERRLA, EOMI Nose: Nares, septum and mucosa are normal, no drainage or sinus tenderness Neck: No adenopathy, supple, symmetrical, trachea midline, thyroid not enlarged, no tenderness,   well-healed surgical scar in thyroid area Resp: Clear to auscultation bilaterally Cardio: Regular rate and rhythm, S1, S2 normal, no murmur, click, rub or gallop Breasts:  Bilateral mastectomies with reconstruction and reconstructed nipples , well-healed surgical scars,  right upper extremity lymphedema,  right axillary fullness GI: Soft, distended, non-tender, hypoactive bowel sounds, no organomegaly Extremities: Extremities normal, atraumatic, no cyanosis, right upper extremity lymphedema  Lymph nodes: Cervical, supraclavicular, and axillary nodes normal Neurologic: Grossly normal    ECOG FS:  Grade 0           LAB RESULTS: CBC Latest Ref Rng 09/01/2014 06/16/2010 11/25/2009  WBC 3.9 - 10.3 10e3/uL 5.2 5.6 5.8  Hemoglobin 11.6 - 15.9 g/dL 13.2 13.6 13.6  Hematocrit 34.8 - 46.6 % 39.7 39.6 39.0  Platelets 145 - 400 10e3/uL 250 281 348     CMP Latest Ref Rng 09/01/2014 06/16/2010 11/25/2009  Glucose 70 - 140 mg/dl 91 96 98  BUN 7.0 - 26.0 mg/dL 7.2 10 8   Creatinine 0.6 - 1.1 mg/dL 0.7 0.79 0.85  Sodium 136 - 145 mEq/L 140 140 139  Potassium 3.5 - 5.1 mEq/L 3.6 3.8 4.1  Chloride 96 - 112 mEq/L - 103 106  CO2 22 - 29 mEq/L 24 26 19   Calcium 8.4 - 10.4 mg/dL 9.2 10.0 9.3  Total Protein 6.4 - 8.3 g/dL 6.8 7.3 7.7  Total Bilirubin 0.20 - 1.20 mg/dL 0.53 0.6 0.5  Alkaline Phos 40 - 150 U/L 78 77 66  AST 5 - 34 U/L 20 15 12   ALT 0 - 55 U/L 27 14 9      RADIOGRAPHIC STUDIES: No results found.   ASSESSMENT: 38 y.o.  [place], [place] woman with a history of stage I a triple negative right breast cancer. She completed her treatment 7 years ago.    PLAN: 1.  She is  having years out of her initial diagnosis and treatment, the cancer recurrence risk is very low female. We'll follow her yearly. 2. Her last breast MRI was in May 2013, we'll obtain the repeated one in the next few months. -I encouraged her to continue exercise, have healthy diet, and take calcium and vitamin D supplement.  Truitt Merle  09/01/2014

## 2014-09-01 NOTE — Telephone Encounter (Signed)
gv adn printed appt sched and avs for pt for DEc 2016...sed aded tx.

## 2014-09-04 ENCOUNTER — Encounter: Payer: Self-pay | Admitting: Hematology

## 2014-10-08 ENCOUNTER — Encounter: Payer: Self-pay | Admitting: Hematology

## 2014-10-15 ENCOUNTER — Ambulatory Visit (HOSPITAL_COMMUNITY)
Admission: RE | Admit: 2014-10-15 | Discharge: 2014-10-15 | Disposition: A | Discharge status: Home / Self Care | Payer: BLUE CROSS/BLUE SHIELD | Source: Ambulatory Visit | Attending: Hematology | Admitting: Hematology

## 2014-10-15 DIAGNOSIS — Z853 Personal history of malignant neoplasm of breast: Secondary | ICD-10-CM | POA: Diagnosis present

## 2014-10-15 DIAGNOSIS — Z9013 Acquired absence of bilateral breasts and nipples: Secondary | ICD-10-CM | POA: Diagnosis not present

## 2014-10-15 MED ORDER — GADOBENATE DIMEGLUMINE 529 MG/ML IV SOLN
17.0000 mL | Freq: Once | INTRAVENOUS | Status: AC | PRN
  Administered 2014-10-15: 17 mL via INTRAVENOUS

## 2014-10-21 ENCOUNTER — Encounter: Payer: Self-pay | Admitting: Hematology

## 2014-10-27 ENCOUNTER — Telehealth: Payer: Self-pay | Admitting: *Deleted

## 2014-10-27 NOTE — Telephone Encounter (Signed)
Received my chart message from patient wanting MRI of breast results. Per Awilda Metro, PA, MRI is negative. This message was left on patient's secure voice mail. Instructed patient to call if she had any further questions or concerns.

## 2014-11-01 ENCOUNTER — Encounter: Payer: Self-pay | Admitting: Hematology

## 2014-11-03 ENCOUNTER — Encounter: Payer: Self-pay | Admitting: Hematology

## 2014-11-03 ENCOUNTER — Telehealth: Payer: Self-pay | Admitting: Hematology

## 2014-11-03 NOTE — Telephone Encounter (Signed)
Left message to confirm change appointment from 12/07 to 12/08. Mailed calendar.

## 2014-11-03 NOTE — Progress Notes (Unsigned)
11/03/2014 I left a detailed VM on patient's phone explaing how the appeals process works.  Dr. Burr Medico is writing a letter of medical necessity to go along with the patient's appeal request.  Bonnie Harper

## 2015-01-27 ENCOUNTER — Other Ambulatory Visit: Payer: Self-pay | Admitting: Nurse Practitioner

## 2015-01-27 ENCOUNTER — Other Ambulatory Visit (HOSPITAL_COMMUNITY)
Admission: RE | Admit: 2015-01-27 | Discharge: 2015-01-27 | Disposition: A | Discharge status: Home / Self Care | Payer: BLUE CROSS/BLUE SHIELD | Source: Ambulatory Visit | Attending: Obstetrics and Gynecology | Admitting: Obstetrics and Gynecology

## 2015-01-27 DIAGNOSIS — Z01419 Encounter for gynecological examination (general) (routine) without abnormal findings: Secondary | ICD-10-CM | POA: Insufficient documentation

## 2015-01-28 LAB — CYTOLOGY - PAP

## 2015-09-07 ENCOUNTER — Other Ambulatory Visit: Payer: Managed Care, Other (non HMO)

## 2015-09-07 ENCOUNTER — Ambulatory Visit: Payer: Managed Care, Other (non HMO) | Admitting: Hematology

## 2015-09-07 ENCOUNTER — Other Ambulatory Visit: Payer: Self-pay | Admitting: *Deleted

## 2015-09-07 DIAGNOSIS — C50011 Malignant neoplasm of nipple and areola, right female breast: Secondary | ICD-10-CM

## 2015-09-08 ENCOUNTER — Telehealth: Payer: Self-pay | Admitting: Hematology

## 2015-09-08 ENCOUNTER — Other Ambulatory Visit (HOSPITAL_BASED_OUTPATIENT_CLINIC_OR_DEPARTMENT_OTHER): Payer: BLUE CROSS/BLUE SHIELD

## 2015-09-08 ENCOUNTER — Ambulatory Visit (HOSPITAL_BASED_OUTPATIENT_CLINIC_OR_DEPARTMENT_OTHER): Payer: BLUE CROSS/BLUE SHIELD | Admitting: Hematology

## 2015-09-08 ENCOUNTER — Ambulatory Visit
Admission: RE | Admit: 2015-09-08 | Discharge: 2015-09-08 | Disposition: A | Discharge status: Home / Self Care | Payer: BLUE CROSS/BLUE SHIELD | Source: Ambulatory Visit | Attending: Hematology | Admitting: Hematology

## 2015-09-08 ENCOUNTER — Encounter: Payer: Self-pay | Admitting: Hematology

## 2015-09-08 VITALS — BP 132/90 | HR 80 | Temp 98.6°F | Resp 18 | Ht 64.0 in | Wt 189.6 lb

## 2015-09-08 DIAGNOSIS — Z853 Personal history of malignant neoplasm of breast: Secondary | ICD-10-CM

## 2015-09-08 DIAGNOSIS — Z85850 Personal history of malignant neoplasm of thyroid: Secondary | ICD-10-CM

## 2015-09-08 DIAGNOSIS — C50011 Malignant neoplasm of nipple and areola, right female breast: Secondary | ICD-10-CM

## 2015-09-08 LAB — CBC WITH DIFFERENTIAL/PLATELET
BASO%: 0.9 % (ref 0.0–2.0)
Basophils Absolute: 0.1 10*3/uL (ref 0.0–0.1)
EOS%: 2.6 % (ref 0.0–7.0)
Eosinophils Absolute: 0.1 10*3/uL (ref 0.0–0.5)
HCT: 41.1 % (ref 34.8–46.6)
HGB: 14 g/dL (ref 11.6–15.9)
LYMPH%: 19.3 % (ref 14.0–49.7)
MCH: 31.8 pg (ref 25.1–34.0)
MCHC: 34.1 g/dL (ref 31.5–36.0)
MCV: 93.2 fL (ref 79.5–101.0)
MONO#: 0.7 10*3/uL (ref 0.1–0.9)
MONO%: 11.6 % (ref 0.0–14.0)
NEUT#: 3.7 10*3/uL (ref 1.5–6.5)
NEUT%: 65.6 % (ref 38.4–76.8)
Platelets: 275 10*3/uL (ref 145–400)
RBC: 4.4 10*6/uL (ref 3.70–5.45)
RDW: 12.8 % (ref 11.2–14.5)
WBC: 5.7 10*3/uL (ref 3.9–10.3)
lymph#: 1.1 10*3/uL (ref 0.9–3.3)

## 2015-09-08 LAB — COMPREHENSIVE METABOLIC PANEL
ALT: 12 U/L (ref 0–55)
AST: 15 U/L (ref 5–34)
Albumin: 4.1 g/dL (ref 3.5–5.0)
Alkaline Phosphatase: 80 U/L (ref 40–150)
Anion Gap: 13 mEq/L — ABNORMAL HIGH (ref 3–11)
BUN: 8.6 mg/dL (ref 7.0–26.0)
CO2: 21 mEq/L — ABNORMAL LOW (ref 22–29)
Calcium: 9.8 mg/dL (ref 8.4–10.4)
Chloride: 108 mEq/L (ref 98–109)
Creatinine: 0.8 mg/dL (ref 0.6–1.1)
EGFR: >90 mL/min/{1.73_m2} (ref >90)
Glucose: 98 mg/dl (ref 70–140)
Potassium: 4.2 mEq/L (ref 3.5–5.1)
Sodium: 142 mEq/L (ref 136–145)
Total Bilirubin: 0.53 mg/dL (ref 0.20–1.20)
Total Protein: 7.5 g/dL (ref 6.4–8.3)

## 2015-09-08 NOTE — Telephone Encounter (Signed)
Gave and printd appt sched and avs fo rpt for DEC 2017

## 2015-09-08 NOTE — Progress Notes (Signed)
[place]  Telephone:([phone] Fax:(336) [phone]  OFFICE PROGRESS NOTE  PATIENT: Bonnie Harper   DOB: 11/02/1975  MR#: [id number]  YQI#:[id number]   LO:VFIE Bonnie Dust, MD Bonnie A. Corder, NP-C Carman Ching, M.D. 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  Status post right needle core biopsy on 02/28/2007 which showed invasive mammary carcinoma that appeared to be intermediate to high-grade invasive ductal carcinoma, ER 0%, PR 0%, Ki-67 59%, HER-2/neu negative.  2.  Bilateral breast MRI on 03/12/2007 which showed an enhancing solitary mass with washout rim enhancement at the 6 o'clock position of the right breast that measured 1.4 x 1.4 x 1.4 cm.  No other abnormality identified on the right.  Images on the left breast were unremarkable.  No enlarged internal mammary or axillary lymph nodes were identified.  (T1 NX).  3.  The patient had a 2-D echocardiogram on 03/26/2007 which showed an ejection fraction of 50%.  4.  The patient underwent neoadjuvant chemotherapy with dose dense FEC from 04/01/2007 through 05/22/2007 x 4 cycles with Neulasta support and dose dense Taxotere from 06/05/2007 through 07/03/2007 x 3 cycles with Neulasta support.  5.  The patient had genetic counseling and testing.  A [facility] report dated 04/23/2007 showed the patient tested negative for the BRCA1 and BRCA2 gene mutations.  6.  The patient underwent bilateral mastectomies with sentinel node biopsy and reconstruction surgery performed at the [facility] on 08/14/2007.  7.  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  CURRENT THERAPY: Surveillance (Breast MRI every 1-2 years and follow up).   INTERVAL  HISTORY: Bonnie Harper returns for follow-up. She feels well overall, mild fatigue is stable. She denies any new pain, dyspnea, or other symptoms. She noticed a small lump at the right breast surgical site in the past months, moderately tender, no skin change. She denies any injury to the breast.   PAST MEDICAL HISTORY: Past Medical History  Diagnosis Date  . Breast cancer ([facility]) 02/28/2007    Right breast s/p bilat mastectomies  . Cancer ([facility]) 11/03/2009    Thyroid cancer s/p thyroidectomy   . Anxiety   . IBS (irritable bowel syndrome)   . Thyroid disease     PAST SURGICAL HISTORY: Past Surgical History  Procedure Laterality Date  . Cholecystectomy    . Mastectomy Bilateral 08/14/2007  . Port-a-cath removal  08/14/2007  . Breast reconstruction Bilateral   . Cesarean section      C-section x 2  . Thyroidectomy  11/03/2009    FAMILY HISTORY: Family History  Problem Relation Age of Onset  . Stroke Father 38  . Diabetes Father   . Asthma Father   . Cancer Maternal Uncle 47    melanoma  . Cancer Maternal Grandmother 49    bilateral breast cancer  . Cancer Other 85    mat great aunt through [facility] with breast cancer  . Cancer Other 4    maternal great aunt through [facility] with breast cancer at age 68, ovarian ca at 33 and 2nd breast primary at 55    SOCIAL HISTORY: Social History  Substance Use Topics  . Smoking status: Former Smoker    Quit date: 04/04/2002  . Smokeless tobacco: Never Used  . Alcohol Use: 1.2 oz/week  2 Glasses of wine per week    ALLERGIES: Allergies  Allergen Reactions  . Zofran Other (See Comments)    migraines    MEDICATIONS:  Current Outpatient Prescriptions  Medication Sig Dispense Refill  . Calcium Citrate-Vitamin D (CITRACAL + D PO) Take 1 tablet by mouth daily.    . cetirizine (ZYRTEC) 10 MG tablet Take 10 mg by mouth daily.    . Cholecalciferol (VITAMIN D PO) Take 5,000 Units by mouth daily.    . cholestyramine (QUESTRAN) 4 G packet  Take 4 g by mouth daily.    Marland Kitchen levothyroxine (SYNTHROID) 100 MCG tablet Take 100 mcg by mouth daily before breakfast.    . liothyronine (CYTOMEL) 25 MCG tablet Take 12.5 mcg by mouth daily.     Marland Kitchen LORazepam (ATIVAN) 1 MG tablet Take 1 mg by mouth every 8 (eight) hours.    . Multiple Vitamin (MULTIVITAMIN) tablet Take 1 tablet by mouth daily.     No current facility-administered medications for this visit.    GYN HISTORY: G2P2, son 32 and daughter 49   REVIEW OF SYSTEMS: A 10 point review of systems was completed and is negative except as noted above.    PHYSICAL EXAMINATION: BP 132/90 mmHg  Pulse 80  Temp(Src) 98.6 F (37 C)  Resp 18  Ht _0  (1.626 m)  Wt 189 lb 9.6 oz (86.002 kg)  BMI 32.53 kg/m2  SpO2 100%   General appearance: Alert, cooperative, well nourished, no apparent distress Head: Normocephalic, without obvious abnormality, atraumatic Eyes: Conjunctivae/corneas clear, PERRLA, EOMI Nose: Nares, septum and mucosa are normal, no drainage or sinus tenderness Neck: No adenopathy, supple, symmetrical, trachea midline, thyroid not enlarged, no tenderness,   well-healed surgical scar in thyroid area Resp: Clear to auscultation bilaterally Cardio: Regular rate and rhythm, S1, S2 normal, no murmur, click, rub or gallop Breasts:  Bilateral mastectomies with reconstruction and reconstructed nipples , well-healed surgical scars, mild scar tissue, I did not palpate any lump around her surgical scar, or any part of her breast, no axillary adenopathy. GI: Soft, distended, non-tender, hypoactive bowel sounds, no organomegaly Extremities: Extremities normal, atraumatic, no cyanosis, right upper extremity lymphedema  Lymph nodes: No palpable cervical, supraclavicular, and axillary nodes  Neurologic: Grossly normal    ECOG FS:  0           LAB RESULTS: CBC Latest Ref Rng 09/08/2015 09/01/2014 06/16/2010  WBC 3.9 - 10.3 10e3/uL 5.7 5.2 5.6  Hemoglobin 11.6 - 15.9 g/dL 14.0 13.2 13.6   Hematocrit 34.8 - 46.6 % 41.1 39.7 39.6  Platelets 145 - 400 10e3/uL 275 250 281    CMP Latest Ref Rng 09/08/2015 09/01/2014 06/16/2010  Glucose 70 - 140 mg/dl 98 91 96  BUN 7.0 - 26.0 mg/dL 8.6 7.2 10  Creatinine 0.6 - 1.1 mg/dL 0.8 0.7 0.79  Sodium 136 - 145 mEq/L 142 140 140  Potassium 3.5 - 5.1 mEq/L 4.2 3.6 3.8  Chloride 96 - 112 mEq/L - - 103  CO2 22 - 29 mEq/L 21(L) 24 26  Calcium 8.4 - 10.4 mg/dL 9.8 9.2 10.0  Total Protein 6.4 - 8.3 g/dL 7.5 6.8 7.3  Total Bilirubin 0.20 - 1.20 mg/dL 0.53 0.53 0.6  Alkaline Phos 40 - 150 U/L 80 78 77  AST 5 - 34 U/L _1 ALT 0 - 55 U/L _2 RADIOGRAPHIC STUDIES: No results found.  B.l breast MRI 10/15/2014  IMPRESSION: Negative.  RECOMMENDATION: Breast MRI  every 1-2 years is suggested considering the patient's history.  ASSESSMENT: 39 y.o.  [place], [place] woman with a history of stage I a triple negative right breast cancer. She completed her treatment 8 years ago.    PLAN: -She is 8 years out of her initial diagnosis and treatment, the cancer recurrence risk is very low now, especially for triple negative breast cancer. We'll follow her yearly. -She is concerned about a possible lump near her surgical scar in the right breast, my exam was negative. I'll send her for a right breast and axilla ultrasound for further evaluation in the next few weeks. -I reviewed her lab test result with her, both CBC and CMP are within normal limits. Laboratory report was given to her -We discussed routine breast MI is probably not indicated anymore if the ultrasound negative. We'll follow-up clinically with exam and lab test. -I encouraged her to continue exercise, have healthy diet, and take calcium and vitamin D supplement.  Truitt Merle  09/08/2015

## 2016-02-28 ENCOUNTER — Other Ambulatory Visit: Payer: Self-pay | Admitting: Internal Medicine

## 2016-02-28 DIAGNOSIS — I82402 Acute embolism and thrombosis of unspecified deep veins of left lower extremity: Secondary | ICD-10-CM

## 2016-03-01 ENCOUNTER — Ambulatory Visit
Admission: RE | Admit: 2016-03-01 | Discharge: 2016-03-01 | Disposition: A | Discharge status: Home / Self Care | Payer: BLUE CROSS/BLUE SHIELD | Source: Ambulatory Visit | Attending: Internal Medicine | Admitting: Internal Medicine

## 2016-03-01 DIAGNOSIS — I82402 Acute embolism and thrombosis of unspecified deep veins of left lower extremity: Secondary | ICD-10-CM

## 2016-06-20 ENCOUNTER — Other Ambulatory Visit: Payer: Self-pay | Admitting: Internal Medicine

## 2016-06-20 ENCOUNTER — Ambulatory Visit
Admission: RE | Admit: 2016-06-20 | Discharge: 2016-06-20 | Disposition: A | Discharge status: Home / Self Care | Payer: No Typology Code available for payment source | Source: Ambulatory Visit | Attending: Internal Medicine | Admitting: Internal Medicine

## 2016-06-20 DIAGNOSIS — R1031 Right lower quadrant pain: Secondary | ICD-10-CM

## 2016-06-20 MED ORDER — IOPAMIDOL (ISOVUE-300) INJECTION 61%
100.0000 mL | Freq: Once | INTRAVENOUS | Status: AC | PRN
  Administered 2016-06-20: 100 mL via INTRAVENOUS

## 2016-06-21 ENCOUNTER — Other Ambulatory Visit: Payer: Self-pay | Admitting: Internal Medicine

## 2016-06-21 DIAGNOSIS — R1031 Right lower quadrant pain: Secondary | ICD-10-CM

## 2016-09-05 NOTE — Progress Notes (Signed)
[place]  Telephone:(336) [phone] Fax:(336) [phone]  OFFICE PROGRESS NOTE  PATIENT: Bonnie Harper   DOB: Mar 25, 1976  MR#: [id number]  IRJ#:[id number]   TK:ZSWF Alroy Dust, MD Harvette A. Corder, NP-C Carman Ching, M.D. 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  Status post right needle core biopsy on 02/28/2007 which showed invasive mammary carcinoma that appeared to be intermediate to high-grade invasive ductal carcinoma, ER 0%, PR 0%, Ki-67 59%, HER-2/neu negative.  2.  Bilateral breast MRI on 03/12/2007 which showed an enhancing solitary mass with washout rim enhancement at the 6 o'clock position of the right breast that measured 1.4 x 1.4 x 1.4 cm.  No other abnormality identified on the right.  Images on the left breast were unremarkable.  No enlarged internal mammary or axillary lymph nodes were identified.  (T1 NX).  3.  The patient had a 2-D echocardiogram on 03/26/2007 which showed an ejection fraction of 50%.  4.  The patient underwent neoadjuvant chemotherapy with dose dense FEC from 04/01/2007 through 05/22/2007 x 4 cycles with Neulasta support and dose dense Taxotere from 06/05/2007 through 07/03/2007 x 3 cycles with Neulasta support.  5.  The patient had genetic counseling and testing.  A BRCAnalysis report dated 04/23/2007 showed the patient tested negative for the BRCA1 and BRCA2 gene mutations.  6.  The patient underwent bilateral mastectomies with sentinel node biopsy and reconstruction surgery performed at the [facility] on 08/14/2007.  7.  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  CURRENT THERAPY: Surveillance    INTERVAL HISTORY: [name] returns for follow-up. She  was last seen by me one year ago. she is doing very well, denies any significant pain or other complaints.  she had an episode of abdominal pain and underwent CT scan in September 2017, which was negative, no concerning for cancer recurrence. She has been exercise regularly, and her thyroid medication was adjusted, so she intentionally lost about 20 pounds in the past year.    PAST MEDICAL HISTORY: Past Medical History:  Diagnosis Date  . Anxiety   . Breast cancer ([facility]) 02/28/2007   Right breast s/p bilat mastectomies  . Cancer ([facility]) 11/03/2009   Thyroid cancer s/p thyroidectomy   . IBS (irritable bowel syndrome)   . Thyroid disease     PAST SURGICAL HISTORY: Past Surgical History:  Procedure Laterality Date  . BREAST RECONSTRUCTION Bilateral   . CESAREAN SECTION     C-section x 2  . CHOLECYSTECTOMY    . MASTECTOMY Bilateral 08/14/2007  . PORT-A-CATH REMOVAL  08/14/2007  . THYROIDECTOMY  11/03/2009    FAMILY HISTORY: Family History  Problem Relation Age of Onset  . Stroke Father 27  . Diabetes Father   . Asthma Father   . Cancer Maternal Uncle 54    melanoma  . Cancer Maternal Grandmother 52    bilateral breast cancer  . Cancer Other 65    mat great aunt through [facility] with breast cancer  . Cancer Other 31    maternal great aunt through [facility] with breast cancer at age 61, ovarian ca at 30 and 2nd breast primary at 79    SOCIAL HISTORY: Social History  Substance Use Topics  . Smoking status: Former Smoker    Quit date: 04/04/2002  .  Smokeless tobacco: Never Used  . Alcohol use 1.2 oz/week    2 Glasses of wine per week    ALLERGIES: Allergies  Allergen Reactions  . Zofran Other (See Comments)    migraines    MEDICATIONS:  Current Outpatient Prescriptions  Medication Sig Dispense Refill  . Calcium Citrate-Vitamin D (CITRACAL + D PO) Take 1 tablet by mouth daily.    . cetirizine (ZYRTEC) 10 MG tablet Take 10 mg by mouth daily.    . Cholecalciferol (VITAMIN D PO)  Take 5,000 Units by mouth daily.    . cholestyramine (QUESTRAN) 4 G packet Take 4 g by mouth daily.    Marland Kitchen levothyroxine (SYNTHROID) 100 MCG tablet Take 100 mcg by mouth daily before breakfast.    . LORazepam (ATIVAN) 1 MG tablet Take 1 mg by mouth every 8 (eight) hours.    . Multiple Vitamin (MULTIVITAMIN) tablet Take 1 tablet by mouth daily.    Marland Kitchen BELVIQ 10 MG TABS Take 18 mg by mouth 2 (two) times daily.  1  . liothyronine (CYTOMEL) 5 MCG tablet Take 1 tablet by mouth 2 (two) times daily.  4   No current facility-administered medications for this visit.     GYN HISTORY: G2P2, son 22 and daughter 42   REVIEW OF SYSTEMS: A 10 point review of systems was completed and is negative except as noted above.    PHYSICAL EXAMINATION: BP 134/80 (BP Location: Left Arm, Patient Position: Sitting)   Pulse 77   Temp 97.6 F (36.4 C) (Oral)   Resp 18   Ht _0  (1.626 m)   Wt 167 lb 12.8 oz (76.1 kg)   SpO2 100%   BMI 28.80 kg/m    General appearance: Alert, cooperative, well nourished, no apparent distress Head: Normocephalic, without obvious abnormality, atraumatic Eyes: Conjunctivae/corneas clear, PERRLA, EOMI Nose: Nares, septum and mucosa are normal, no drainage or sinus tenderness Neck: No adenopathy, supple, symmetrical, trachea midline, thyroid not enlarged, no tenderness,   well-healed surgical scar in thyroid area Resp: Clear to auscultation bilaterally Cardio: Regular rate and rhythm, S1, S2 normal, no murmur, click, rub or gallop Breasts:  Bilateral mastectomies with reconstruction and reconstructed nipples , well-healed surgical scars, mild scar tissue, I did not palpate any lump around her surgical scar, or any part of her breast, no axillary adenopathy. GI: Soft, distended, non-tender, hypoactive bowel sounds, no organomegaly Extremities: Extremities normal, atraumatic, no cyanosis, right upper extremity lymphedema  Lymph nodes: No palpable cervical, supraclavicular, and axillary  nodes  Neurologic: Grossly normal    ECOG FS:  0           LAB RESULTS: CBC Latest Ref Rng & Units 09/06/2016 09/08/2015 09/01/2014  WBC 3.9 - 10.3 10e3/uL 4.8 5.7 5.2  Hemoglobin 11.6 - 15.9 g/dL 13.9 14.0 13.2  Hematocrit 34.8 - 46.6 % 41.0 41.1 39.7  Platelets 145 - 400 10e3/uL 272 275 250    CMP Latest Ref Rng & Units 09/06/2016 09/08/2015 09/01/2014  Glucose 70 - 140 mg/dl 94 98 91  BUN 7.0 - 26.0 mg/dL 8.1 8.6 7.2  Creatinine 0.6 - 1.1 mg/dL 0.8 0.8 0.7  Sodium 136 - 145 mEq/L 141 142 140  Potassium 3.5 - 5.1 mEq/L 4.2 4.2 3.6  Chloride 96 - 112 mEq/L - - -  CO2 22 - 29 mEq/L 25 21(L) 24  Calcium 8.4 - 10.4 mg/dL 9.5 9.8 9.2  Total Protein 6.4 - 8.3 g/dL 7.4 7.5 6.8  Total Bilirubin 0.20 - 1.20 mg/dL  0.75 0.53 0.53  Alkaline Phos 40 - 150 U/L 87 80 78  AST 5 - 34 U/L _0 ALT 0 - 55 U/L _1 RADIOGRAPHIC STUDIES: No results found.  B.l breast MRI 10/15/2014  IMPRESSION: Negative.  RECOMMENDATION: Breast MRI every 1-2 years is suggested considering the patient's history.  ASSESSMENT: 40 y.o.  [place], [place] woman with a history of stage I a triple negative right breast cancer. She completed her treatment 9 years ago.    PLAN: -She is 9 years out of her initial diagnosis and treatment, the cancer recurrence risk is very low now, especially for triple negative breast cancer.  -she last [place] in 2016, breast MRI in early 2016 and CT abd/pel in 06/2016 shows no evidence of cancer recurrence.  -I reviewed her lab test result with her, both CBC and CMP are within normal limits. Laboratory report was given to her -She is clinically doing very well, exam was unremarkable today, no clinical concern of recurrence. -We discussed routine breast MI is probably not indicated anymore if the ultrasound negative. We'll follow-up clinically with exam and lab test. -I encouraged her to continue exercise, have healthy diet, and take calcium and vitamin D  supplement. -She will be discharged from my clinic. She will follow-up with her primary care physician and gynecologist for annual checkup. I'll only see her as needed in the future.   Truitt Merle  09/06/2016

## 2016-09-06 ENCOUNTER — Encounter: Payer: Self-pay | Admitting: Hematology

## 2016-09-06 ENCOUNTER — Other Ambulatory Visit (HOSPITAL_BASED_OUTPATIENT_CLINIC_OR_DEPARTMENT_OTHER): Payer: No Typology Code available for payment source

## 2016-09-06 ENCOUNTER — Ambulatory Visit (HOSPITAL_BASED_OUTPATIENT_CLINIC_OR_DEPARTMENT_OTHER): Payer: No Typology Code available for payment source | Admitting: Hematology

## 2016-09-06 VITALS — BP 134/80 | HR 77 | Temp 97.6°F | Resp 18 | Ht 64.0 in | Wt 167.8 lb

## 2016-09-06 DIAGNOSIS — Z853 Personal history of malignant neoplasm of breast: Secondary | ICD-10-CM | POA: Diagnosis not present

## 2016-09-06 LAB — COMPREHENSIVE METABOLIC PANEL
ALT: 19 U/L (ref 0–55)
AST: 17 U/L (ref 5–34)
Albumin: 3.9 g/dL (ref 3.5–5.0)
Alkaline Phosphatase: 87 U/L (ref 40–150)
Anion Gap: 10 mEq/L (ref 3–11)
BUN: 8.1 mg/dL (ref 7.0–26.0)
CO2: 25 mEq/L (ref 22–29)
Calcium: 9.5 mg/dL (ref 8.4–10.4)
Chloride: 106 mEq/L (ref 98–109)
Creatinine: 0.8 mg/dL (ref 0.6–1.1)
EGFR: >90 mL/min/{1.73_m2} (ref >90)
Glucose: 94 mg/dl (ref 70–140)
Potassium: 4.2 mEq/L (ref 3.5–5.1)
Sodium: 141 mEq/L (ref 136–145)
Total Bilirubin: 0.75 mg/dL (ref 0.20–1.20)
Total Protein: 7.4 g/dL (ref 6.4–8.3)

## 2016-09-06 LAB — CBC WITH DIFFERENTIAL/PLATELET
BASO%: 1 % (ref 0.0–2.0)
Basophils Absolute: 0 10*3/uL (ref 0.0–0.1)
EOS%: 1.7 % (ref 0.0–7.0)
Eosinophils Absolute: 0.1 10*3/uL (ref 0.0–0.5)
HCT: 41 % (ref 34.8–46.6)
HGB: 13.9 g/dL (ref 11.6–15.9)
LYMPH%: 20.6 % (ref 14.0–49.7)
MCH: 31.7 pg (ref 25.1–34.0)
MCHC: 34 g/dL (ref 31.5–36.0)
MCV: 93.3 fL (ref 79.5–101.0)
MONO#: 0.6 10*3/uL (ref 0.1–0.9)
MONO%: 13.3 % (ref 0.0–14.0)
NEUT#: 3 10*3/uL (ref 1.5–6.5)
NEUT%: 63.4 % (ref 38.4–76.8)
Platelets: 272 10*3/uL (ref 145–400)
RBC: 4.4 10*6/uL (ref 3.70–5.45)
RDW: 12.1 % (ref 11.2–14.5)
WBC: 4.8 10*3/uL (ref 3.9–10.3)
lymph#: 1 10*3/uL (ref 0.9–3.3)

## 2017-06-21 ENCOUNTER — Other Ambulatory Visit: Payer: Self-pay | Admitting: Specialist

## 2017-06-21 DIAGNOSIS — M5416 Radiculopathy, lumbar region: Secondary | ICD-10-CM

## 2017-10-21 ENCOUNTER — Ambulatory Visit
Admission: RE | Admit: 2017-10-21 | Discharge: 2017-10-21 | Disposition: A | Discharge status: Home / Self Care | Payer: No Typology Code available for payment source | Source: Ambulatory Visit | Attending: Specialist | Admitting: Specialist

## 2017-10-21 DIAGNOSIS — M5416 Radiculopathy, lumbar region: Secondary | ICD-10-CM

## 2017-10-21 MED ORDER — IOPAMIDOL (ISOVUE-M 200) INJECTION 41%
1.0000 mL | Freq: Once | INTRAMUSCULAR | Status: AC
  Administered 2017-10-21: 1 mL via EPIDURAL

## 2017-10-21 MED ORDER — METHYLPREDNISOLONE ACETATE 40 MG/ML INJ SUSP (RADIOLOG
120.0000 mg | Freq: Once | INTRAMUSCULAR | Status: AC
  Administered 2017-10-21: 120 mg via EPIDURAL

## 2017-10-21 NOTE — Discharge Instructions (Signed)

## 2018-01-01 ENCOUNTER — Other Ambulatory Visit: Payer: Self-pay | Admitting: Internal Medicine

## 2018-01-01 DIAGNOSIS — Z853 Personal history of malignant neoplasm of breast: Secondary | ICD-10-CM

## 2018-01-05 ENCOUNTER — Ambulatory Visit
Admission: RE | Admit: 2018-01-05 | Discharge: 2018-01-05 | Disposition: A | Discharge status: Home / Self Care | Payer: No Typology Code available for payment source | Source: Ambulatory Visit | Attending: Internal Medicine | Admitting: Internal Medicine

## 2018-01-05 DIAGNOSIS — Z853 Personal history of malignant neoplasm of breast: Secondary | ICD-10-CM

## 2018-01-05 MED ORDER — GADOBENATE DIMEGLUMINE 529 MG/ML IV SOLN
19.0000 mL | Freq: Once | INTRAVENOUS | Status: AC | PRN
  Administered 2018-01-05: 19 mL via INTRAVENOUS

## 2018-07-22 ENCOUNTER — Other Ambulatory Visit: Payer: Self-pay | Admitting: Nurse Practitioner

## 2018-07-22 ENCOUNTER — Other Ambulatory Visit (HOSPITAL_COMMUNITY)
Admission: RE | Admit: 2018-07-22 | Discharge: 2018-07-22 | Disposition: A | Discharge status: Home / Self Care | Payer: PRIVATE HEALTH INSURANCE | Source: Ambulatory Visit | Attending: Nurse Practitioner | Admitting: Nurse Practitioner

## 2018-07-22 DIAGNOSIS — Z01419 Encounter for gynecological examination (general) (routine) without abnormal findings: Secondary | ICD-10-CM | POA: Insufficient documentation

## 2018-07-24 LAB — CYTOLOGY - PAP
Diagnosis: NEGATIVE
HPV: NOT DETECTED

## 2018-10-03 ENCOUNTER — Ambulatory Visit
Admission: RE | Admit: 2018-10-03 | Discharge: 2018-10-03 | Disposition: A | Discharge status: Home / Self Care | Payer: No Typology Code available for payment source | Source: Ambulatory Visit | Attending: Geriatric Medicine | Admitting: Geriatric Medicine

## 2018-10-03 ENCOUNTER — Other Ambulatory Visit: Payer: Self-pay | Admitting: Geriatric Medicine

## 2018-10-03 DIAGNOSIS — M79672 Pain in left foot: Secondary | ICD-10-CM

## 2018-10-16 ENCOUNTER — Encounter: Payer: Self-pay | Admitting: Podiatry

## 2018-10-16 ENCOUNTER — Ambulatory Visit (INDEPENDENT_AMBULATORY_CARE_PROVIDER_SITE_OTHER): Payer: PRIVATE HEALTH INSURANCE

## 2018-10-16 ENCOUNTER — Ambulatory Visit (INDEPENDENT_AMBULATORY_CARE_PROVIDER_SITE_OTHER): Payer: PRIVATE HEALTH INSURANCE | Admitting: Podiatry

## 2018-10-16 VITALS — HR 90 | Resp 16

## 2018-10-16 DIAGNOSIS — S99922A Unspecified injury of left foot, initial encounter: Secondary | ICD-10-CM

## 2018-10-16 DIAGNOSIS — M722 Plantar fascial fibromatosis: Secondary | ICD-10-CM | POA: Diagnosis not present

## 2018-10-16 MED ORDER — METHYLPREDNISOLONE 4 MG PO TBPK: ORAL_TABLET | ORAL | 0 refills | Status: DC

## 2018-10-16 NOTE — Patient Instructions (Signed)

## 2018-10-16 NOTE — Progress Notes (Signed)
Subjective:    Patient ID: Bonnie Harper, female    DOB: 11-10-1975, 43 y.o.   MRN: [id number]  HPI 43 year old female presents the office today for concerns of pain to the left foot.  She states that she may have injured it around [place] 20 [id number].  She saw Dr. Felipa Eth for this as well.  She describes burning, throbbing sensation mostly the left heel area.  She states that she slid down steps when the pain started. She was in [place] at the time.  After the fall she had immediate pain to the foot and since then she still continued to have pain.  Denies any popping sensation.  She had immediate swelling.  She has no other concerns.   Review of Systems  All other systems reviewed and are negative.  Past Medical History:  Diagnosis Date  . Anxiety   . Breast cancer ([facility]) 02/28/2007   Right breast s/p bilat mastectomies  . Cancer ([facility]) 11/03/2009   Thyroid cancer s/p thyroidectomy   . IBS (irritable bowel syndrome)   . Thyroid disease     Past Surgical History:  Procedure Laterality Date  . BREAST RECONSTRUCTION Bilateral   . CESAREAN SECTION     C-section x 2  . CHOLECYSTECTOMY    . MASTECTOMY Bilateral 08/14/2007  . PORT-A-CATH REMOVAL  08/14/2007  . THYROIDECTOMY  11/03/2009     Current Outpatient Medications:  .  Calcium Citrate-Vitamin D (CITRACAL + D PO), Take 1 tablet by mouth daily., Disp: , Rfl:  .  cetirizine (ZYRTEC) 10 MG tablet, Take 10 mg by mouth daily., Disp: , Rfl:  .  Cholecalciferol (VITAMIN D PO), Take 5,000 Units by mouth daily., Disp: , Rfl:  .  cholestyramine (QUESTRAN) 4 G packet, Take 4 g by mouth daily., Disp: , Rfl:  .  levothyroxine (SYNTHROID) 100 MCG tablet, Take 100 mcg by mouth daily before breakfast., Disp: , Rfl:  .  liothyronine (CYTOMEL) 5 MCG tablet, Take 1 tablet by mouth 2 (two) times daily., Disp: , Rfl: 4 .  LORazepam (ATIVAN) 1 MG tablet, Take 1 mg by mouth every 8 (eight) hours., Disp: , Rfl:  .  methylPREDNISolone (MEDROL  DOSEPAK) 4 MG TBPK tablet, Take as directed, Disp: 21 tablet, Rfl: 0 .  Multiple Vitamin (MULTIVITAMIN) tablet, Take 1 tablet by mouth daily., Disp: , Rfl:   Allergies  Allergen Reactions  . Zofran Other (See Comments)    migraines         Objective:   Physical Exam  General: AAO x3, NAD  Dermatological: Skin is warm, dry and supple bilateral. Nails x 10 are well manicured; remaining integument appears unremarkable at this time. There are no open sores, no preulcerative lesions, no rash or signs of infection present.  Vascular: Dorsalis Pedis artery and Posterior Tibial artery pedal pulses are 2/4 bilateral with immedate capillary fill time. There is no pain with calf compression, swelling, warmth, erythema.   Neruologic: Grossly intact via light touch bilateral.  Protective threshold with Semmes Wienstein monofilament intact to all pedal sites bilateral.  Negative Tinel sign.  Musculoskeletal: There is tenderness palpation on the plantar aspect of the heel along the insertion of the plantar fashion along the course of the plantar fashion the arch of the foot.  Achilles tendon is intact and Thompson test is negative.  No pain with lateral compression of the calcaneus.  There is no area pinpoint bony tenderness pain vibratory sensation.  No pain on the dorsal foot.  Mild edema on the plantar left heel compared to contralateral extremity.  No erythema or warmth.  Muscular strength 5/5 in all groups tested bilateral.  Gait: Unassisted, Nonantalgic.      Assessment & Plan:  43 year old female left heel pain, likely plantar fasciitis but concern for partial tearing given injury -Treatment options discussed including all alternatives, risks, and complications -Etiology of symptoms were discussed -X-rays were obtained and reviewed with the patient.  There is no evidence of acute fracture or stress fracture identified at this time. -Given her injury as well as the pain on 1 on the lives in a  cam boot which was dispensed today. -Medrol Dosepak prescribed. -Ice daily  Return in about 2 weeks (around 10/30/2018).  Trula Slade DPM

## 2018-10-24 ENCOUNTER — Other Ambulatory Visit: Payer: Self-pay | Admitting: Orthopedic Surgery

## 2018-10-24 DIAGNOSIS — M545 Low back pain, unspecified: Secondary | ICD-10-CM

## 2018-10-24 DIAGNOSIS — G8929 Other chronic pain: Secondary | ICD-10-CM

## 2018-10-30 ENCOUNTER — Encounter: Payer: Self-pay | Admitting: Podiatry

## 2018-10-30 ENCOUNTER — Ambulatory Visit (INDEPENDENT_AMBULATORY_CARE_PROVIDER_SITE_OTHER): Payer: PRIVATE HEALTH INSURANCE | Admitting: Podiatry

## 2018-10-30 DIAGNOSIS — M722 Plantar fascial fibromatosis: Secondary | ICD-10-CM | POA: Diagnosis not present

## 2018-10-30 MED ORDER — IBUPROFEN 800 MG PO TABS: 800.0000 mg | ORAL_TABLET | Freq: Three times a day (TID) | ORAL | 0 refills | Status: DC | PRN

## 2018-10-30 NOTE — Patient Instructions (Signed)

## 2018-10-31 ENCOUNTER — Ambulatory Visit
Admission: RE | Admit: 2018-10-31 | Discharge: 2018-10-31 | Disposition: A | Discharge status: Home / Self Care | Payer: PRIVATE HEALTH INSURANCE | Source: Ambulatory Visit | Attending: Orthopedic Surgery | Admitting: Orthopedic Surgery

## 2018-10-31 DIAGNOSIS — M545 Low back pain, unspecified: Secondary | ICD-10-CM

## 2018-10-31 DIAGNOSIS — G8929 Other chronic pain: Secondary | ICD-10-CM

## 2018-10-31 NOTE — Progress Notes (Signed)
Subjective: 43 year old female presents the office today for follow-up evaluation of left heel pain, plantar fasciitis/concern for partial tear.  Overall she states that she is doing much better.  She states her pain levels 3/10.  She is remained in the cam boot.  She is able to walk on this without any significant discomfort.  Follow-up the pain is improving.  No recent injury as I last saw her any changes. Denies any systemic complaints such as fevers, chills, nausea, vomiting. No acute changes since last appointment, and no other complaints at this time.   Objective: AAO x3, NAD DP/PT pulses palpable bilaterally, CRT less than 3 seconds There is decreased tenderness palpation on the plantar medial tubercle of the calcaneus at the insertion of the plantar fascia.  There is no pain on the course the plantar fashion the arch of the foot.  No pain on the Achilles tendon.  No pain with lateral compression of the calcaneus.  Decreased edema to the plantar heel.  Negative Tinel sign. No open lesions or pre-ulcerative lesions.  No pain with calf compression, swelling, warmth, erythema  Assessment: Resolving left heel pain, plantar fasciitis/concern for partial tear  Plan: -All treatment options discussed with the patient including all alternatives, risks, complications.  -Overall she is doing better.  I want her start to transition out of the cam boot back into regular, supportive shoe.  Dispensed a plantar fascial brace.  I want her to continue with stretching, ice exercises daily.  Overall she is doing much better but if symptoms continue can discuss a steroid injection.  Prescribed ibuprofen 800 mg to take as needed. -Patient encouraged to call the office with any questions, concerns, change in symptoms.   Trula Slade DPM

## 2018-11-21 ENCOUNTER — Other Ambulatory Visit: Payer: Self-pay

## 2018-11-21 DIAGNOSIS — M9904 Segmental and somatic dysfunction of sacral region: Secondary | ICD-10-CM

## 2018-11-27 ENCOUNTER — Ambulatory Visit: Payer: PRIVATE HEALTH INSURANCE | Admitting: Podiatry

## 2018-12-01 ENCOUNTER — Other Ambulatory Visit: Payer: PRIVATE HEALTH INSURANCE

## 2018-12-02 ENCOUNTER — Ambulatory Visit
Admission: RE | Admit: 2018-12-02 | Discharge: 2018-12-02 | Disposition: A | Discharge status: Home / Self Care | Payer: PRIVATE HEALTH INSURANCE | Source: Ambulatory Visit | Attending: Orthopedic Surgery | Admitting: Orthopedic Surgery

## 2018-12-02 DIAGNOSIS — M9904 Segmental and somatic dysfunction of sacral region: Secondary | ICD-10-CM

## 2018-12-02 MED ORDER — METHYLPREDNISOLONE ACETATE 40 MG/ML INJ SUSP (RADIOLOG: 120.0000 mg | Freq: Once | INTRAMUSCULAR | Status: DC

## 2019-10-02 HISTORY — PX: IMPLANT OF A SILASTIC METATARSAL PHALANGE JOINT: SHX6452

## 2020-01-12 ENCOUNTER — Other Ambulatory Visit: Payer: Self-pay | Admitting: Internal Medicine

## 2020-01-12 DIAGNOSIS — Z853 Personal history of malignant neoplasm of breast: Secondary | ICD-10-CM

## 2020-02-08 ENCOUNTER — Ambulatory Visit
Admission: RE | Admit: 2020-02-08 | Discharge: 2020-02-08 | Disposition: A | Discharge status: Home / Self Care | Payer: PRIVATE HEALTH INSURANCE | Source: Ambulatory Visit | Attending: Internal Medicine | Admitting: Internal Medicine

## 2020-02-08 DIAGNOSIS — Z853 Personal history of malignant neoplasm of breast: Secondary | ICD-10-CM

## 2020-02-08 MED ORDER — GADOBUTROL 1 MMOL/ML IV SOLN
9.0000 mL | Freq: Once | INTRAVENOUS | Status: AC | PRN
  Administered 2020-02-08: 12:00:00 9 mL via INTRAVENOUS

## 2020-03-25 ENCOUNTER — Telehealth: Payer: Self-pay | Admitting: Hematology

## 2020-03-25 NOTE — Telephone Encounter (Signed)
Release: [id number] Sent patient's medical records to her MyChart per her request. I spoke w/ patient and informed we are unable to e-mail, patient was okay with retrieving them from her mychart.

## 2020-05-19 ENCOUNTER — Encounter: Payer: Self-pay | Admitting: Genetic Counselor

## 2020-06-14 ENCOUNTER — Telehealth: Payer: Self-pay | Admitting: Genetic Counselor

## 2020-06-14 NOTE — Telephone Encounter (Signed)
LM on VM that I was calling back to discuss updated testing.  Left CB instructions.

## 2020-07-11 ENCOUNTER — Inpatient Hospital Stay: Payer: PRIVATE HEALTH INSURANCE | Attending: Hematology | Admitting: Genetic Counselor

## 2020-07-11 ENCOUNTER — Other Ambulatory Visit: Payer: PRIVATE HEALTH INSURANCE

## 2020-07-11 ENCOUNTER — Encounter: Payer: Self-pay | Admitting: Genetic Counselor

## 2020-07-11 DIAGNOSIS — Z803 Family history of malignant neoplasm of breast: Secondary | ICD-10-CM | POA: Diagnosis not present

## 2020-07-11 DIAGNOSIS — Z85850 Personal history of malignant neoplasm of thyroid: Secondary | ICD-10-CM

## 2020-07-11 DIAGNOSIS — Z853 Personal history of malignant neoplasm of breast: Secondary | ICD-10-CM

## 2020-07-11 DIAGNOSIS — Z800 Family history of malignant neoplasm of digestive organs: Secondary | ICD-10-CM

## 2020-07-11 DIAGNOSIS — Z8041 Family history of malignant neoplasm of ovary: Secondary | ICD-10-CM | POA: Diagnosis not present

## 2020-07-11 NOTE — Progress Notes (Signed)
REFERRING PROVIDER: None for this visit   PRIMARY PROVIDER:  Schoenhoff, Altamese Cabal, MD  PRIMARY REASON FOR VISIT:  1. History of breast cancer   2. History of thyroid cancer   3. Family history of malignant neoplasm of breast   4. Family history of ovarian cancer   5. Family history of colon cancer    I connected with Bonnie Harper on 07/11/2020 at [place] EDT by [place] video conference and verified that I am speaking with the correct person using two identifiers.   HISTORY OF PRESENT ILLNESS:   Bonnie Harper, a 44 y.o. female, was seen for a [place] cancer genetics consultation due to a personal history of breast and thyroid cancer and a family history of breast and ovarian cancer.  Bonnie Harper presents to clinic today to discuss the possibility of a hereditary predisposition to cancer, genetic testing, and to further clarify her future cancer risks, as well as potential cancer risks for family members.   At the age of 40, Bonnie Harper had a benign tumor removed from her upper jaw. 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 The treatment plan included bilateral mastectomy.  In 2011, at the age of 42, Bonnie Harper was diagnosed with multifocal follicular variant papillary thyroid cancer.  The treatment plan included a thyroidectomy.   RISK FACTORS:  Menarche was at age 70.  First live birth at age 38.  OCP use for approximately 0 years.  Ovaries intact: yes.  Hysterectomy: no.  Menopausal status: perimenopausal.  HRT use: 0 years. Colonoscopy: most recent colonoscopy at age 81 due to weight loss Mammogram within the last year: no; breast MRIs every other year. Number of breast biopsies: 1. Up to date with pelvic exams: yes; most recent PAP in 2019  Past Medical History:  Diagnosis Date  . Anxiety   . Breast cancer ([place]) 02/28/2007   Right breast s/p bilat mastectomies  . Cancer ([place]) 11/03/2009   Thyroid cancer s/p  thyroidectomy   . IBS (irritable bowel syndrome)   . Thyroid disease     Past Surgical History:  Procedure Laterality Date  . BREAST RECONSTRUCTION Bilateral   . CESAREAN SECTION     C-section x 2  . CHOLECYSTECTOMY    . MASTECTOMY Bilateral 08/14/2007  . PORT-A-CATH REMOVAL  08/14/2007  . THYROIDECTOMY  11/03/2009    Social History   Socioeconomic History  . Marital status: Married    Spouse name: Not on file  . Number of children: 2  . Years of education: Not on file  . Highest education level: Not on file  Occupational History  . Not on file  Tobacco Use  . Smoking status: Former Smoker    Quit date: 04/04/2002    Years since quitting: 18.2  . Smokeless tobacco: Never Used  Substance and Sexual Activity  . Alcohol use: Yes    Alcohol/week: 2.0 standard drinks    Types: 2 Glasses of wine per week  . Drug use: No  . Sexual activity: Yes    Birth control/protection: Other-see comments    Comment: Husband is s/p vasectomy  Other Topics Concern  . Not on file  Social History Narrative  . Not on file   Social Determinants of Health   Financial Resource Strain:   . Difficulty of Paying Living Expenses: Not on file  Food Insecurity:   . Worried About Charity fundraiser in the Last Year:  Not on file  . Ran Out of Food in the Last Year: Not on file  Transportation Needs:   . Lack of Transportation (Medical): Not on file  . Lack of Transportation (Non-Medical): Not on file  Physical Activity:   . Days of Exercise per Week: Not on file  . Minutes of Exercise per Session: Not on file  Stress:   . Feeling of Stress : Not on file  Social Connections:   . Frequency of Communication with Friends and Family: Not on file  . Frequency of Social Gatherings with Friends and Family: Not on file  . Attends Religious Services: Not on file  . Active Member of Clubs or Organizations: Not on file  . Attends Archivist Meetings: Not on file  . Marital Status: Not on  file     FAMILY HISTORY:  We obtained a detailed, 4-generation family history.  Significant diagnoses are listed below: Family History  Problem Relation Age of Onset  . Cancer Maternal Uncle 70       melanoma  . Cancer Maternal Grandmother 40       bilateral breast cancer  . Skin cancer Maternal Grandmother        BCC, SCC, and melanoma   . Cancer Other 29       mat great aunt through [facility] with breast cancer  . Cancer Other 61       maternal great aunt through [facility] with breast cancer at age 56, ovarian ca at 43 and 2nd breast primary in 97s  . Lung cancer Paternal Uncle 51  . Cancer Paternal Grandfather        oral; dx late 8s     Bonnie Harper has one son, age 66, and one daughter, age 84, both without a history of cancer.  Bonnie Harper has one full sister, age 53, and one paternal half sister, age 54, both without a cancer history.  Bonnie Harper mother is 72 and did not have cancer.  Bonnie Harper mother had a hysterectomy and bilateral salpingo-oophorectomy in her 89s.  Her genetic testing was reported to be negative around the year 2015. Bonnie Harper had a paternal uncle with melanoma diagnosed at the age of 30. Bonnie Harper maternal grandmother, age 8, had bilateral breast cancer at age 94 and has a history of basal cell carcinoma, squamous cell carcinoma, and melanoma.  Bonnie Harper great aunt through her maternal grandmother also had breast cancer at age 47 and passed away at 54.  Bonnie Harper other great aunt through her maternal grandmother was diagnosed with breast cancer in her 7s, had a second breast cancer in her 95s, and was diagnosed with ovarian cancer at 65.  Bonnie Harper great grandparents through her maternal grandmother both had a history of colon cancer diagnosed in their 21s and passed away in her 98s. Bonnie Harper maternal grandfather was diagnosed with oral cancer in his late 49s and passed away at age 36.  Bonnie Harper father is 48 and has not had cancer.  Bonnie Harper  paternal uncle with diagnosed with lung cancer at 75 and passed away at 67.  No other paternal history of cancer was reported.   Bonnie Harper is unaware of previous family history of genetic testing for hereditary cancer risks other than that mentioned above. Patient's ancestors are of [place] and [place] descent. There is no reported Ashkenazi Jewish ancestry. There is no known consanguinity.  GENETIC COUNSELING ASSESSMENT: Ms. Fitzgibbons is a 44 y.o. female with  a personal and family history of cancer which is highly suggestive of a hereditary cancer syndrome and predisposition to cancer given the age at which people in there family were diagnosed with cancer. We, therefore, discussed and recommended the following at today's visit.   DISCUSSION: We discussed that 5 - 10% of cancer is hereditary, with most cases of hereditary breast cancer associated with mutations in BRCA1 and BRCA2.  There are other genes that can be associated with hereditary breast cancer syndromes.  Type of cancer risk and level of risk are gene specific.  We discussed that testing is beneficial for several reasons, including knowing about other cancer risks, identifying potential screening and risk-reduction options that may be appropriate, and to understanding if other family members could be at risk for cancer and allowing them to undergo genetic testing.  We reviewed the characteristics, features and inheritance patterns of hereditary cancer syndromes. We also discussed genetic testing, including the appropriate family members to test, the process of testing, insurance coverage and turn-around-time for results. We discussed the implications of a negative, positive, carrier and/or variant of uncertain significant result. Given that Ms. Nunziata had genetic testing for hereditary cancer previously, we discussed the differences between the testing that was performed in 2015 and genetic testing offered today, including the availability of RNA  testing in addition to DNA testing.  We offered Ms. Strick genetic testing for an expanded pan-cancer panel that includes RNA and DNA analysis for genes associated with breast, ovarian, colon, skin, brain, and other cancers.   The CancerNext-Expanded gene panel offered by [facility] and includes sequencing and rearrangement analysis for the following 77 genes: AIP, ALK, APC*, ATM*, AXIN2, BAP1, BARD1, BLM, BMPR1A, BRCA1*, BRCA2*, BRIP1*, CDC73, CDH1*, CDK4, CDKN1B, CDKN2A, CHEK2*, CTNNA1, DICER1, FANCC, FH, FLCN, GALNT12, KIF1B, LZTR1, MAX, MEN1, MET, MLH1*, MSH2*, MSH3, MSH6*, MUTYH*, NBN, NF1*, NF2, NTHL1, PALB2*, PHOX2B, PMS2*, POT1, PRKAR1A, PTCH1, PTEN*, RAD51C*, RAD51D*, RB1, RECQL, RET, SDHA, SDHAF2, SDHB, SDHC, SDHD, SMAD4, SMARCA4, SMARCB1, SMARCE1, STK11, SUFU, TMEM127, TP53*, TSC1, TSC2, VHL and XRCC2 (sequencing and deletion/duplication); EGFR, EGLN1, HOXB13, KIT, MITF, PDGFRA, POLD1, and POLE (sequencing only); EPCAM and GREM1 (deletion/duplication only). DNA and RNA analyses performed for * genes.  Based on Ms. Samaan's personal and family history of cancer, she meets medical criteria for genetic testing. Despite that she meets criteria, she may still have an out of pocket cost. We discussed that if her out of pocket cost for testing is over $100, the laboratory will call and confirm whether she wants to proceed with testing.  If the out of pocket cost of testing is less than $100 she will be billed by the genetic testing laboratory.     PLAN: After considering the risks, benefits, and limitations, Ms. Mcjunkins provided informed consent to pursue genetic testing. The blood sample will be sent to [facility] for analysis of the [facility]- Expanded Panel after her laboratory appointment on July 14, 2020. Results should be available within approximately 3 weeks' time, at which point they will be disclosed by telephone to Ms. Meda Coffee, as will any additional recommendations warranted by  these results. Ms. Deeg will receive a summary of her genetic counseling visit and a copy of her results once available. This information will also be available in Epic.   Lastly, we encouraged Ms. Birchall to remain in contact with cancer genetics annually so that we can continuously update the family history and inform her of any changes in cancer genetics and testing that may be  of benefit for this family.   Ms. Dardis questions were answered to her satisfaction today. Our contact information was provided should additional questions or concerns arise. Thank you for the referral and allowing [place] to share in the care of your patient.   Zared Knoth M. Joette Catching, [place], [facility] Certified Film/video [email or web address] (P) [phone]   The patient was seen for a total of 25 minutes in televideo genetic counseling.  This patient was discussed with Drs. Magrinat, Lindi Adie and/or Burr Medico who agrees with the above.   _______________________________________________________________________ For Office Staff:  Number of people involved in session: 1 Was an Intern/ student involved with case: no

## 2020-07-14 ENCOUNTER — Other Ambulatory Visit: Payer: Self-pay

## 2020-07-14 ENCOUNTER — Inpatient Hospital Stay: Payer: PRIVATE HEALTH INSURANCE

## 2020-07-14 DIAGNOSIS — Z853 Personal history of malignant neoplasm of breast: Secondary | ICD-10-CM

## 2020-08-02 ENCOUNTER — Encounter: Payer: Self-pay | Admitting: Genetic Counselor

## 2020-08-02 ENCOUNTER — Telehealth: Payer: Self-pay | Admitting: Genetic Counselor

## 2020-08-02 ENCOUNTER — Ambulatory Visit: Payer: Self-pay | Admitting: Genetic Counselor

## 2020-08-02 DIAGNOSIS — Z853 Personal history of malignant neoplasm of breast: Secondary | ICD-10-CM

## 2020-08-02 DIAGNOSIS — Z1379 Encounter for other screening for genetic and chromosomal anomalies: Secondary | ICD-10-CM

## 2020-08-02 DIAGNOSIS — Z803 Family history of malignant neoplasm of breast: Secondary | ICD-10-CM

## 2020-08-02 DIAGNOSIS — Z85850 Personal history of malignant neoplasm of thyroid: Secondary | ICD-10-CM

## 2020-08-02 NOTE — Progress Notes (Signed)
HPI:  Bonnie Harper was previously seen in the [facility] clinic due to a personal and family history of cancer and concerns regarding a hereditary predisposition to cancer. Please refer to our prior cancer genetics clinic note for more information regarding our discussion, assessment and recommendations, at the time. Bonnie Harper recent genetic test results were disclosed to her, as were recommendations warranted by these results. These results and recommendations are discussed in more detail below.  CANCER HISTORY:  At the age of 30, Bonnie Harper had a benign tumor removed from her upper jaw. 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 The treatment plan included bilateral mastectomy.  In 2011, at the age of 82, Bonnie Harper was diagnosed with multifocal follicular variant papillary thyroid cancer.  The treatment plan included a thyroidectomy.   FAMILY HISTORY:  We obtained a detailed, 4-generation family history.  Significant diagnoses are listed below: Family History  Problem Relation Age of Onset  . Cancer Maternal Uncle 80       melanoma  . Cancer Maternal Grandmother 109       bilateral breast cancer  . Skin cancer Maternal Grandmother        BCC, SCC, and melanoma   . Cancer Other 35       mat great aunt through [facility] with breast cancer  . Cancer Other 86       maternal great aunt through [facility] with breast cancer at age 97, ovarian ca at 70 and 2nd breast primary in 54s  . Lung cancer Paternal Uncle 67  . Cancer Paternal Grandfather        oral; dx late 56s      Bonnie Harper has one son, age 79, and one daughter, age 81, both without a history of cancer.  Bonnie Harper has one full sister, age 58, and one paternal half sister, age 9, both without a cancer history.  Bonnie Harper mother is 42 and did not have cancer.  Bonnie Harper mother had a hysterectomy and bilateral salpingo-oophorectomy in her 63s.  Her  genetic testing was reported to be negative around the year 2015. Bonnie Harper had a paternal uncle with melanoma diagnosed at the age of 23. Bonnie Harper maternal grandmother, age 22, had bilateral breast cancer at age 55 and has a history of basal cell carcinoma, squamous cell carcinoma, and melanoma.  Bonnie Harper great aunt through her maternal grandmother also had breast cancer at age 9 and passed away at 97.  Bonnie Harper other great aunt through her maternal grandmother was diagnosed with breast cancer in her 58s, had a second breast cancer in her 47s, and was diagnosed with ovarian cancer at 58.  Bonnie Harper great grandparents through her maternal grandmother both had a history of colon cancer diagnosed in their 101s and passed away in her 73s. Bonnie Harper maternal grandfather was diagnosed with oral cancer in his late 40s and passed away at age 61.  Bonnie Harper's father is 72 and has not had cancer.  Bonnie Harper paternal uncle with diagnosed with lung cancer at 12 and passed away at 27.  No other paternal history of cancer was reported.   Bonnie Harper is unaware of previous family history of genetic testing for hereditary cancer risks other than that mentioned above. Patient's ancestors are of [place] and [place] descent. There is no reported Ashkenazi Jewish ancestry. There is no known consanguinity.  GENETIC TEST  RESULTS: Genetic testing reported out on July 28, 2020.  The [facility] CancerNext-Expanded + RNA Insight Panel found no pathogenic mutations. The CancerNext-Expanded gene panel offered by [facility] and includes sequencing and rearrangement analysis for the following 77 genes: AIP, ALK, APC*, ATM*, AXIN2, BAP1, BARD1, BLM, BMPR1A, BRCA1*, BRCA2*, BRIP1*, CDC73, CDH1*, CDK4, CDKN1B, CDKN2A, CHEK2*, CTNNA1, DICER1, FANCC, FH, FLCN, GALNT12, KIF1B, LZTR1, MAX, MEN1, MET, MLH1*, MSH2*, MSH3, MSH6*, MUTYH*, NBN, NF1*, NF2, NTHL1, PALB2*, PHOX2B, PMS2*, POT1, PRKAR1A, PTCH1, PTEN*, RAD51C*,  RAD51D*, RB1, RECQL, RET, SDHA, SDHAF2, SDHB, SDHC, SDHD, SMAD4, SMARCA4, SMARCB1, SMARCE1, STK11, SUFU, TMEM127, TP53*, TSC1, TSC2, VHL and XRCC2 (sequencing and deletion/duplication); EGFR, EGLN1, HOXB13, KIT, MITF, PDGFRA, POLD1, and POLE (sequencing only); EPCAM and GREM1 (deletion/duplication only). DNA and RNA analyses performed for * genes.  The test report has been scanned into EPIC and is located under the Molecular Pathology section of the Results Review tab.  A portion of the result report is included below for reference.     We discussed with Bonnie Harper that because current genetic testing is not perfect, it is possible there may be a gene mutation in one of these genes that current testing cannot detect, but that chance is small.  We also discussed, that there could be another gene that has not yet been discovered, or that we have not yet tested, that is responsible for the cancer diagnoses in the family. It is also possible there is a hereditary cause for the cancer in the family that Bonnie Harper did not inherit and therefore was not identified in her testing.  Therefore, it is important to remain in touch with cancer genetics in the future so that we can continue to offer Bonnie Harper the most up to date genetic testing.   ADDITIONAL GENETIC TESTING: We discussed with Bonnie Harper that her genetic testing was fairly extensive.  If there are genes identified to increase cancer risk that can be analyzed in the future, we would be happy to discuss and coordinate this testing at that time.    CANCER SCREENING RECOMMENDATIONS: Bonnie Harper test result is considered negative (normal).  This means that we have not identified a hereditary cause for her personal and family history of cancer at this time. Most cancers happen by chance and this negative test suggests that her cancer may fall into this category.    Given Bonnie Harper's personal and family histories, we must interpret these negative results  with some caution.  Families with features suggestive of hereditary risk for cancer tend to have multiple family members with cancer, diagnoses in multiple generations and diagnoses before the age of 37. Ms. Santoni's family exhibits some of these features. Thus, this result may simply reflect our current inability to detect all mutations within these genes or there may be a different gene that has not yet been discovered or tested.   An individual's cancer risk and medical management are not determined by genetic test results alone. Overall cancer risk assessment incorporates additional factors, including personal medical history, family history, and any available genetic information that may result in a personalized plan for cancer prevention and surveillance.  RECOMMENDATIONS FOR FAMILY MEMBERS:  Individuals in this family might be at some increased risk of developing cancer, over the general population risk, simply due to the family history of cancer.  We recommended women in this family have a yearly mammogram beginning at age 74, or 75 years younger than the earliest onset of cancer, an annual clinical  breast exam, and perform monthly breast self-exams. Women in this family should also have a gynecological exam as recommended by their primary provider. All family members should be referred for colonoscopy starting at age 6.  It is also possible there is a hereditary cause for the cancer in Ms. Figge's family that she did not inherit and therefore was not identified in her.  Based on Ms. Mcclintock's family history, we recommended her maternal grandmother, who was diagnosed with bilateral breast cancer at age 58, have genetic counseling and testing. Ms. Enlow will let us know if we can be of any assistance in coordinating genetic counseling and/or testing for this family member.   FOLLOW-UP: Lastly, we discussed with Ms. Caloca that cancer genetics is a rapidly advancing field and it is possible that new  genetic tests will be appropriate for her and/or her family members in the future. We encouraged her to remain in contact with cancer genetics on an annual basis so we can update her personal and family histories and let her know of advances in cancer genetics that may benefit this family.   Our contact number was provided. Ms. Herter questions were answered to her satisfaction, and she knows she is welcome to call us at anytime with additional questions or concerns.   Bonnie Harper M. Bonnie Harper, [place], [facility] Certified Film/video [email or web address] (P) [phone]

## 2020-08-02 NOTE — Telephone Encounter (Signed)
Revealed negative genetic testing.  Discussed that we do not know why she had breast cancer or why there is cancer in the family. It could be sporadic/familial, due to a different gene that we are not testing, or maybe our current technology may not be able to pick something up.  It will be important for her to keep in contact with genetics to keep up with whether additional testing may be needed.

## 2021-02-26 ENCOUNTER — Encounter (HOSPITAL_COMMUNITY): Payer: Self-pay | Admitting: Emergency Medicine

## 2021-02-26 ENCOUNTER — Emergency Department (HOSPITAL_COMMUNITY)
Admission: EM | Admit: 2021-02-26 | Discharge: 2021-02-26 | Disposition: A | Discharge status: Home / Self Care | Payer: No Typology Code available for payment source | Attending: Emergency Medicine | Admitting: Emergency Medicine

## 2021-02-26 ENCOUNTER — Emergency Department (HOSPITAL_COMMUNITY): Payer: No Typology Code available for payment source

## 2021-02-26 DIAGNOSIS — M549 Dorsalgia, unspecified: Secondary | ICD-10-CM | POA: Diagnosis not present

## 2021-02-26 DIAGNOSIS — Y92410 Unspecified street and highway as the place of occurrence of the external cause: Secondary | ICD-10-CM | POA: Insufficient documentation

## 2021-02-26 DIAGNOSIS — Z853 Personal history of malignant neoplasm of breast: Secondary | ICD-10-CM | POA: Diagnosis not present

## 2021-02-26 DIAGNOSIS — S8011XA Contusion of right lower leg, initial encounter: Secondary | ICD-10-CM | POA: Diagnosis not present

## 2021-02-26 DIAGNOSIS — S59911A Unspecified injury of right forearm, initial encounter: Secondary | ICD-10-CM | POA: Diagnosis present

## 2021-02-26 DIAGNOSIS — S5011XA Contusion of right forearm, initial encounter: Secondary | ICD-10-CM | POA: Insufficient documentation

## 2021-02-26 DIAGNOSIS — R079 Chest pain, unspecified: Secondary | ICD-10-CM | POA: Diagnosis not present

## 2021-02-26 DIAGNOSIS — Z85850 Personal history of malignant neoplasm of thyroid: Secondary | ICD-10-CM | POA: Insufficient documentation

## 2021-02-26 DIAGNOSIS — Z87891 Personal history of nicotine dependence: Secondary | ICD-10-CM | POA: Insufficient documentation

## 2021-02-26 MED ORDER — IBUPROFEN 400 MG PO TABS
600.0000 mg | ORAL_TABLET | Freq: Once | ORAL | Status: AC
  Administered 2021-02-26: 600 mg via ORAL
  Filled 2021-02-26: qty 1

## 2021-02-26 MED ORDER — OXYCODONE-ACETAMINOPHEN 5-325 MG PO TABS
1.0000 | ORAL_TABLET | Freq: Once | ORAL | Status: AC
Start: 2021-02-26 — End: 2021-02-26
  Administered 2021-02-26: 1 via ORAL
  Filled 2021-02-26: qty 1
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
## 2021-02-26 NOTE — ED Notes (Signed)
ED Provider at bedside. 
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
## 2021-02-26 NOTE — Discharge Instructions (Addendum)
You can take 600 mg of ibuprofen every 6 hours, you can take 1000 mg of Tylenol every 6 hours, you can alternate these every 3 or you can take them together.
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
## 2021-03-01 ENCOUNTER — Other Ambulatory Visit: Payer: Self-pay | Admitting: Internal Medicine

## 2021-03-02 ENCOUNTER — Other Ambulatory Visit: Payer: Self-pay | Admitting: Internal Medicine

## 2021-03-02 DIAGNOSIS — R911 Solitary pulmonary nodule: Secondary | ICD-10-CM

## 2021-03-21 ENCOUNTER — Other Ambulatory Visit: Payer: PRIVATE HEALTH INSURANCE

## 2021-05-01 ENCOUNTER — Ambulatory Visit
Admission: RE | Admit: 2021-05-01 | Discharge: 2021-05-01 | Disposition: A | Discharge status: Home / Self Care | Payer: No Typology Code available for payment source | Source: Ambulatory Visit | Attending: Internal Medicine | Admitting: Internal Medicine

## 2021-05-01 ENCOUNTER — Other Ambulatory Visit: Payer: Self-pay

## 2021-05-01 DIAGNOSIS — R911 Solitary pulmonary nodule: Secondary | ICD-10-CM

## 2021-05-03 ENCOUNTER — Other Ambulatory Visit (HOSPITAL_COMMUNITY): Payer: Self-pay | Admitting: Internal Medicine

## 2021-05-03 DIAGNOSIS — R911 Solitary pulmonary nodule: Secondary | ICD-10-CM

## 2021-05-17 ENCOUNTER — Other Ambulatory Visit: Payer: Self-pay

## 2021-05-17 ENCOUNTER — Ambulatory Visit (HOSPITAL_COMMUNITY)
Admission: RE | Admit: 2021-05-17 | Discharge: 2021-05-17 | Disposition: A | Discharge status: Home / Self Care | Payer: No Typology Code available for payment source | Source: Ambulatory Visit | Attending: Internal Medicine | Admitting: Internal Medicine

## 2021-05-17 DIAGNOSIS — R911 Solitary pulmonary nodule: Secondary | ICD-10-CM | POA: Insufficient documentation

## 2021-05-17 LAB — GLUCOSE, CAPILLARY: Glucose-Capillary: 99 mg/dL (ref 70–99)

## 2021-05-17 MED ORDER — FLUDEOXYGLUCOSE F - 18 (FDG) INJECTION
8.4000 | Freq: Once | INTRAVENOUS | Status: AC
  Administered 2021-05-17: 8.22 via INTRAVENOUS

## 2021-05-19 ENCOUNTER — Other Ambulatory Visit: Payer: Self-pay | Admitting: Internal Medicine

## 2021-05-24 ENCOUNTER — Other Ambulatory Visit: Payer: Self-pay | Admitting: Internal Medicine

## 2021-05-24 DIAGNOSIS — R911 Solitary pulmonary nodule: Secondary | ICD-10-CM

## 2021-08-02 ENCOUNTER — Other Ambulatory Visit: Payer: Self-pay

## 2021-08-02 ENCOUNTER — Ambulatory Visit
Admission: RE | Admit: 2021-08-02 | Discharge: 2021-08-02 | Disposition: A | Discharge status: Home / Self Care | Payer: No Typology Code available for payment source | Source: Ambulatory Visit | Attending: Internal Medicine | Admitting: Internal Medicine

## 2021-08-02 ENCOUNTER — Other Ambulatory Visit: Payer: Self-pay | Admitting: Family Medicine

## 2021-08-02 ENCOUNTER — Other Ambulatory Visit: Payer: No Typology Code available for payment source

## 2021-08-02 ENCOUNTER — Other Ambulatory Visit: Payer: Self-pay | Admitting: Internal Medicine

## 2021-08-02 DIAGNOSIS — R911 Solitary pulmonary nodule: Secondary | ICD-10-CM

## 2021-08-16 ENCOUNTER — Ambulatory Visit (INDEPENDENT_AMBULATORY_CARE_PROVIDER_SITE_OTHER): Payer: No Typology Code available for payment source | Admitting: Pulmonary Disease

## 2021-08-16 ENCOUNTER — Other Ambulatory Visit: Payer: Self-pay

## 2021-08-16 ENCOUNTER — Encounter: Payer: Self-pay | Admitting: Pulmonary Disease

## 2021-08-16 VITALS — BP 124/86 | HR 85 | Temp 97.9°F | Ht 64.0 in | Wt 178.6 lb

## 2021-08-16 DIAGNOSIS — Z803 Family history of malignant neoplasm of breast: Secondary | ICD-10-CM

## 2021-08-16 DIAGNOSIS — R911 Solitary pulmonary nodule: Secondary | ICD-10-CM | POA: Insufficient documentation

## 2021-08-16 DIAGNOSIS — Z853 Personal history of malignant neoplasm of breast: Secondary | ICD-10-CM | POA: Diagnosis not present

## 2021-08-16 DIAGNOSIS — Z85850 Personal history of malignant neoplasm of thyroid: Secondary | ICD-10-CM | POA: Diagnosis not present

## 2021-08-16 NOTE — H&P (View-Only) (Signed)
Synopsis: Referred in November 2022 for lung nodule by [name], MD  Subjective:   PATIENT ID: Bonnie Harper GENDER: female DOB: 1976-03-01, MRN: [id number]  Chief Complaint  Patient presents with   Consult    Patient says nodule is getting a little bigger    This is a 45 year old female, past medical history of breast cancer, right breast status post bilateral mastectomy 2008, history of thyroid cancer status post thyroidectomy in 2011. Patient had a CT scan of the chest on 08/03/2021.  This revealed a right upper lobe 1 cm irregular spiculated nodule.  Also found to have a smaller 4 mm subpleural nodule and an additional left lower lobe 3 mm subpleural nodule.  Due to the patient's history of breast and thyroid cancer there was recommendations for consideration of PET scan versus tissue biopsy.  However back in August 2022 patient did have a PET scan.  There was low-level activity within the dominant right upper lobe pulmonary nodule at that time which recommended a dedicated 39-month CT scan follow-up.  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  She has a small smoking history from age [age] where she smoked just a few cigarettes a day.  In addition to the lung nodule she does have complaints of cough.  They did get a new cat this summer.  Her cough is random but she does state that has been new.  Wondering if the nodule could be related.  OV 08/16/2021 here today to discuss CT scan and considerations for tissue sampling or biopsy as needed.   Past Medical History:  Diagnosis Date   Anxiety    Breast cancer ([place]) 02/28/2007   Right breast s/p bilat mastectomies   Cancer ([place]) 11/03/2009   Thyroid cancer s/p thyroidectomy    IBS (irritable bowel syndrome)    Thyroid disease      Family History  Problem Relation Age of Onset   Stroke Father 88   Diabetes Father    Asthma Father    Cancer Maternal Uncle 25       melanoma   Cancer Maternal  Grandmother 46       bilateral breast cancer   Skin cancer Maternal Grandmother        BCC, SCC, and melanoma    Cancer Other 26       mat great aunt through [facility] with breast cancer   Cancer Other 73       maternal great aunt through [facility] with breast cancer at age 79, ovarian ca at 62 and 2nd breast primary in 34s   Lung cancer Paternal Uncle 22   Cancer Paternal Grandfather        oral; dx late 57s     Past Surgical History:  Procedure Laterality Date   BREAST RECONSTRUCTION Bilateral    CESAREAN SECTION     C-section x 2   CHOLECYSTECTOMY     MASTECTOMY Bilateral 08/14/2007   PORT-A-CATH REMOVAL  08/14/2007   THYROIDECTOMY  11/03/2009    Social History   Socioeconomic History   Marital status: Married    Spouse name: Not on file   Number of children: 2   Years of education: Not on file   Highest education level: Not on file  Occupational History   Not on file  Tobacco Use   Smoking status: Former    Types: Cigarettes    Quit date: 04/04/2002    Years since quitting: 19.3   Smokeless tobacco: Never  Substance and Sexual Activity   Alcohol use: Yes    Alcohol/week: 2.0 standard drinks    Types: 2 Glasses of wine per week   Drug use: No   Sexual activity: Yes    Birth control/protection: Other-see comments    Comment: Husband is s/p vasectomy  Other Topics Concern   Not on file  Social History Narrative   Not on file   Social Determinants of Health   Financial Resource Strain: Not on file  Food Insecurity: Not on file  Transportation Needs: Not on file  Physical Activity: Not on file  Stress: Not on file  Social Connections: Not on file  Intimate Partner Violence: Not on file     Allergies  Allergen Reactions   Zofran Other (See Comments)    migraines     Outpatient Medications Prior to Visit  Medication Sig Dispense Refill   Calcium Citrate-Vitamin D (CITRACAL + D PO) Take 1 tablet by mouth daily.     cetirizine (ZYRTEC) 10 MG tablet Take 10 mg by  mouth daily.     Cholecalciferol (VITAMIN D PO) Take 5,000 Units by mouth daily.     cholestyramine (QUESTRAN) 4 G packet Take 4 g by mouth daily.     levothyroxine (SYNTHROID) 100 MCG tablet Take 100 mcg by mouth daily before breakfast.     liothyronine (CYTOMEL) 5 MCG tablet Take 1 tablet by mouth 2 (two) times daily.  4   LORazepam (ATIVAN) 1 MG tablet Take 1 mg by mouth every 8 (eight) hours.     Multiple Vitamin (MULTIVITAMIN) tablet Take 1 tablet by mouth daily.     ibuprofen (ADVIL,MOTRIN) 800 MG tablet Take 1 tablet (800 mg total) by mouth every 8 (eight) hours as needed. 30 tablet 0   methylPREDNISolone (MEDROL DOSEPAK) 4 MG TBPK tablet Take as directed 21 tablet 0   No facility-administered medications prior to visit.    Review of Systems  Constitutional:  Negative for chills, fever, malaise/fatigue and weight loss.  HENT:  Negative for hearing loss, sore throat and tinnitus.   Eyes:  Negative for blurred vision and double vision.  Respiratory:  Positive for cough. Negative for hemoptysis, sputum production, shortness of breath, wheezing and stridor.   Cardiovascular:  Negative for chest pain, palpitations, orthopnea, leg swelling and PND.  Gastrointestinal:  Negative for abdominal pain, constipation, diarrhea, heartburn, nausea and vomiting.  Genitourinary:  Negative for dysuria, hematuria and urgency.  Musculoskeletal:  Negative for joint pain and myalgias.  Skin:  Negative for itching and rash.  Neurological:  Negative for dizziness, tingling, weakness and headaches.  Endo/Heme/Allergies:  Negative for environmental allergies. Does not bruise/bleed easily.  Psychiatric/Behavioral:  Negative for depression. The patient is not nervous/anxious and does not have insomnia.   All other systems reviewed and are negative.   Objective:  Physical Exam Vitals reviewed.  Constitutional:      General: She is not in acute distress.    Appearance: She is well-developed.  HENT:      Head: Normocephalic and atraumatic.  Eyes:     General: No scleral icterus.    Conjunctiva/sclera: Conjunctivae normal.     Pupils: Pupils are equal, round, and reactive to light.  Neck:     Vascular: No JVD.     Trachea: No tracheal deviation.  Cardiovascular:     Rate and Rhythm: Normal rate and regular rhythm.     Heart sounds: Normal heart sounds. No murmur heard. Pulmonary:     Effort:  Pulmonary effort is normal. No tachypnea, accessory muscle usage or respiratory distress.     Breath sounds: Normal breath sounds. No stridor. No wheezing, rhonchi or rales.  Abdominal:     General: Bowel sounds are normal. There is no distension.     Palpations: Abdomen is soft.     Tenderness: There is no abdominal tenderness.  Musculoskeletal:        General: No tenderness.     Cervical back: Neck supple.  Lymphadenopathy:     Cervical: No cervical adenopathy.  Skin:    General: Skin is warm and dry.     Capillary Refill: Capillary refill takes less than 2 seconds.     Findings: No rash.  Neurological:     Mental Status: She is alert and oriented to person, place, and time.  Psychiatric:        Behavior: Behavior normal.     Vitals:   08/16/21 1130  BP: 124/86  Pulse: 85  Temp: 97.9 F (36.6 C)  TempSrc: Oral  SpO2: 100%  Weight: 178 lb 9.6 oz (81 kg)  Height: 5\' 4"  (1.626 m)   100% on RA BMI Readings from Last 3 Encounters:  08/16/21 30.66 kg/m  09/06/16 28.80 kg/m  09/08/15 32.54 kg/m   Wt Readings from Last 3 Encounters:  08/16/21 178 lb 9.6 oz (81 kg)  09/06/16 167 lb 12.8 oz (76.1 kg)  09/08/15 189 lb 9.6 oz (86 kg)     CBC    Component Value Date/Time   WBC 4.8 09/06/2016 0949   WBC 6.7 10/31/2009 1244   RBC 4.40 09/06/2016 0949   RBC 3.86 (L) 10/31/2009 1244   HGB 13.9 09/06/2016 0949   HCT 41.0 09/06/2016 0949   PLT 272 09/06/2016 0949   MCV 93.3 09/06/2016 0949   MCH 31.7 09/06/2016 0949   MCHC 34.0 09/06/2016 0949   MCHC 33.8 10/31/2009 1244    RDW 12.1 09/06/2016 0949   LYMPHSABS 1.0 09/06/2016 0949   MONOABS 0.6 09/06/2016 0949   EOSABS 0.1 09/06/2016 0949   BASOSABS 0.0 09/06/2016 0949    Chest Imaging: 08/02/2021 CT chest: History of breast cancer and thyroid cancer.  Found to have a right upper lobe macrolobulated nodule.  Nodule was demonstrated initially in 02/27/2019 CT.  She also has multiple pulmonary nodules however the largest nodules about 1 cm irregular shaped spiculated margins, macrolobulated.  PET CT imaging was completed in August 2022 which showed low-level uptake. The patient's images have been independently reviewed by me.    Pulmonary Functions Testing Results: No flowsheet data found.  FeNO:   Pathology:   Echocardiogram:   Heart Catheterization:     Assessment & Plan:     ICD-10-CM   1. Lung nodule  R91.1 Ambulatory referral to Pulmonology    Procedural/ Surgical Case Request: VIDEO BRONCHOSCOPY WITH ENDOBRONCHIAL NAVIGATION    2. History of breast cancer  Z85.3     3. History of thyroid cancer  Z85.850     4. Family history of malignant neoplasm of breast  Z80.3       Discussion: This is a 45 year old female, history of breast cancer status post bilateral mastectomy, history of thyroid cancer status post thyroidectomy.  Found to have an incidental right upper lobe macrolobulated 1 cm pulmonary nodule concerning for malignancy she had had subsequent CT imaging this is relatively been stable however due to its morphology and location there was concern of potential malignancy in the setting of her history.  Here  today to discuss biopsy.  Plan: Today in the office we discussed the patient's risk benefits and alternatives of proceeding with robotic assisted navigational bronchoscopy. Patient is agreeable to proceed. We discussed the risk of bleeding and pneumothorax. We will plan for this to be done on 08/29/2021. Nothing to eat past midnight the night before. We appreciate the PCC's help  with scheduling. Will also ensure a follow-up the following week discuss next steps as well as biopsy results   Current Outpatient Medications:    Calcium Citrate-Vitamin D (CITRACAL + D PO), Take 1 tablet by mouth daily., Disp: , Rfl:    cetirizine (ZYRTEC) 10 MG tablet, Take 10 mg by mouth daily., Disp: , Rfl:    Cholecalciferol (VITAMIN D PO), Take 5,000 Units by mouth daily., Disp: , Rfl:    cholestyramine (QUESTRAN) 4 G packet, Take 4 g by mouth daily., Disp: , Rfl:    levothyroxine (SYNTHROID) 100 MCG tablet, Take 100 mcg by mouth daily before breakfast., Disp: , Rfl:    liothyronine (CYTOMEL) 5 MCG tablet, Take 1 tablet by mouth 2 (two) times daily., Disp: , Rfl: 4   LORazepam (ATIVAN) 1 MG tablet, Take 1 mg by mouth every 8 (eight) hours., Disp: , Rfl:    Multiple Vitamin (MULTIVITAMIN) tablet, Take 1 tablet by mouth daily., Disp: , Rfl:   I spent 62 minutes dedicated to the care of this patient on the date of this encounter to include pre-visit review of records, face-to-face time with the patient discussing conditions above, post visit ordering of testing, clinical documentation with the electronic health record, making appropriate referrals as documented, and communicating necessary findings to members of the patients care team.   [name], DO [facility] 08/16/2021 11:34 AM

## 2021-08-16 NOTE — Progress Notes (Signed)
Synopsis: Referred in November 2022 for lung nodule by [name], MD  Subjective:   PATIENT ID: Bonnie Harper GENDER: female DOB: 08/24/1976, MRN: [id number]  Chief Complaint  Patient presents with   Consult    Patient says nodule is getting a little bigger    This is a 45 year old female, past medical history of breast cancer, right breast status post bilateral mastectomy 2008, history of thyroid cancer status post thyroidectomy in 2011. Patient had a CT scan of the chest on 08/03/2021.  This revealed a right upper lobe 1 cm irregular spiculated nodule.  Also found to have a smaller 4 mm subpleural nodule and an additional left lower lobe 3 mm subpleural nodule.  Due to the patient's history of breast and thyroid cancer there was recommendations for consideration of PET scan versus tissue biopsy.  However back in August 2022 patient did have a PET scan.  There was low-level activity within the dominant right upper lobe pulmonary nodule at that time which recommended a dedicated 78-month CT scan follow-up.  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  She has a small smoking history from age [age] where she smoked just a few cigarettes a day.  In addition to the lung nodule she does have complaints of cough.  They did get a new cat this summer.  Her cough is random but she does state that has been new.  Wondering if the nodule could be related.  OV 08/16/2021 here today to discuss CT scan and considerations for tissue sampling or biopsy as needed.   Past Medical History:  Diagnosis Date   Anxiety    Breast cancer ([place]) 02/28/2007   Right breast s/p bilat mastectomies   Cancer ([place]) 11/03/2009   Thyroid cancer s/p thyroidectomy    IBS (irritable bowel syndrome)    Thyroid disease      Family History  Problem Relation Age of Onset   Stroke Father 81   Diabetes Father    Asthma Father    Cancer Maternal Uncle 41       melanoma   Cancer Maternal  Grandmother 46       bilateral breast cancer   Skin cancer Maternal Grandmother        BCC, SCC, and melanoma    Cancer Other 38       mat great aunt through [facility] with breast cancer   Cancer Other 31       maternal great aunt through [facility] with breast cancer at age 27, ovarian ca at 48 and 2nd breast primary in 59s   Lung cancer Paternal Uncle 61   Cancer Paternal Grandfather        oral; dx late 28s     Past Surgical History:  Procedure Laterality Date   BREAST RECONSTRUCTION Bilateral    CESAREAN SECTION     C-section x 2   CHOLECYSTECTOMY     MASTECTOMY Bilateral 08/14/2007   PORT-A-CATH REMOVAL  08/14/2007   THYROIDECTOMY  11/03/2009    Social History   Socioeconomic History   Marital status: Married    Spouse name: Not on file   Number of children: 2   Years of education: Not on file   Highest education level: Not on file  Occupational History   Not on file  Tobacco Use   Smoking status: Former    Types: Cigarettes    Quit date: 04/04/2002    Years since quitting: 19.3   Smokeless tobacco: Never  Substance and Sexual Activity   Alcohol use: Yes    Alcohol/week: 2.0 standard drinks    Types: 2 Glasses of wine per week   Drug use: No   Sexual activity: Yes    Birth control/protection: Other-see comments    Comment: Husband is s/p vasectomy  Other Topics Concern   Not on file  Social History Narrative   Not on file   Social Determinants of Health   Financial Resource Strain: Not on file  Food Insecurity: Not on file  Transportation Needs: Not on file  Physical Activity: Not on file  Stress: Not on file  Social Connections: Not on file  Intimate Partner Violence: Not on file     Allergies  Allergen Reactions   Zofran Other (See Comments)    migraines     Outpatient Medications Prior to Visit  Medication Sig Dispense Refill   Calcium Citrate-Vitamin D (CITRACAL + D PO) Take 1 tablet by mouth daily.     cetirizine (ZYRTEC) 10 MG tablet Take 10 mg by  mouth daily.     Cholecalciferol (VITAMIN D PO) Take 5,000 Units by mouth daily.     cholestyramine (QUESTRAN) 4 G packet Take 4 g by mouth daily.     levothyroxine (SYNTHROID) 100 MCG tablet Take 100 mcg by mouth daily before breakfast.     liothyronine (CYTOMEL) 5 MCG tablet Take 1 tablet by mouth 2 (two) times daily.  4   LORazepam (ATIVAN) 1 MG tablet Take 1 mg by mouth every 8 (eight) hours.     Multiple Vitamin (MULTIVITAMIN) tablet Take 1 tablet by mouth daily.     ibuprofen (ADVIL,MOTRIN) 800 MG tablet Take 1 tablet (800 mg total) by mouth every 8 (eight) hours as needed. 30 tablet 0   methylPREDNISolone (MEDROL DOSEPAK) 4 MG TBPK tablet Take as directed 21 tablet 0   No facility-administered medications prior to visit.    Review of Systems  Constitutional:  Negative for chills, fever, malaise/fatigue and weight loss.  HENT:  Negative for hearing loss, sore throat and tinnitus.   Eyes:  Negative for blurred vision and double vision.  Respiratory:  Positive for cough. Negative for hemoptysis, sputum production, shortness of breath, wheezing and stridor.   Cardiovascular:  Negative for chest pain, palpitations, orthopnea, leg swelling and PND.  Gastrointestinal:  Negative for abdominal pain, constipation, diarrhea, heartburn, nausea and vomiting.  Genitourinary:  Negative for dysuria, hematuria and urgency.  Musculoskeletal:  Negative for joint pain and myalgias.  Skin:  Negative for itching and rash.  Neurological:  Negative for dizziness, tingling, weakness and headaches.  Endo/Heme/Allergies:  Negative for environmental allergies. Does not bruise/bleed easily.  Psychiatric/Behavioral:  Negative for depression. The patient is not nervous/anxious and does not have insomnia.   All other systems reviewed and are negative.   Objective:  Physical Exam Vitals reviewed.  Constitutional:      General: She is not in acute distress.    Appearance: She is well-developed.  HENT:      Head: Normocephalic and atraumatic.  Eyes:     General: No scleral icterus.    Conjunctiva/sclera: Conjunctivae normal.     Pupils: Pupils are equal, round, and reactive to light.  Neck:     Vascular: No JVD.     Trachea: No tracheal deviation.  Cardiovascular:     Rate and Rhythm: Normal rate and regular rhythm.     Heart sounds: Normal heart sounds. No murmur heard. Pulmonary:     Effort:  Pulmonary effort is normal. No tachypnea, accessory muscle usage or respiratory distress.     Breath sounds: Normal breath sounds. No stridor. No wheezing, rhonchi or rales.  Abdominal:     General: Bowel sounds are normal. There is no distension.     Palpations: Abdomen is soft.     Tenderness: There is no abdominal tenderness.  Musculoskeletal:        General: No tenderness.     Cervical back: Neck supple.  Lymphadenopathy:     Cervical: No cervical adenopathy.  Skin:    General: Skin is warm and dry.     Capillary Refill: Capillary refill takes less than 2 seconds.     Findings: No rash.  Neurological:     Mental Status: She is alert and oriented to person, place, and time.  Psychiatric:        Behavior: Behavior normal.     Vitals:   08/16/21 1130  BP: 124/86  Pulse: 85  Temp: 97.9 F (36.6 C)  TempSrc: Oral  SpO2: 100%  Weight: 178 lb 9.6 oz (81 kg)  Height: 5\' 4"  (1.626 m)   100% on RA BMI Readings from Last 3 Encounters:  08/16/21 30.66 kg/m  09/06/16 28.80 kg/m  09/08/15 32.54 kg/m   Wt Readings from Last 3 Encounters:  08/16/21 178 lb 9.6 oz (81 kg)  09/06/16 167 lb 12.8 oz (76.1 kg)  09/08/15 189 lb 9.6 oz (86 kg)     CBC    Component Value Date/Time   WBC 4.8 09/06/2016 0949   WBC 6.7 10/31/2009 1244   RBC 4.40 09/06/2016 0949   RBC 3.86 (L) 10/31/2009 1244   HGB 13.9 09/06/2016 0949   HCT 41.0 09/06/2016 0949   PLT 272 09/06/2016 0949   MCV 93.3 09/06/2016 0949   MCH 31.7 09/06/2016 0949   MCHC 34.0 09/06/2016 0949   MCHC 33.8 10/31/2009 1244    RDW 12.1 09/06/2016 0949   LYMPHSABS 1.0 09/06/2016 0949   MONOABS 0.6 09/06/2016 0949   EOSABS 0.1 09/06/2016 0949   BASOSABS 0.0 09/06/2016 0949    Chest Imaging: 08/02/2021 CT chest: History of breast cancer and thyroid cancer.  Found to have a right upper lobe macrolobulated nodule.  Nodule was demonstrated initially in 02/27/2019 CT.  She also has multiple pulmonary nodules however the largest nodules about 1 cm irregular shaped spiculated margins, macrolobulated.  PET CT imaging was completed in August 2022 which showed low-level uptake. The patient's images have been independently reviewed by me.    Pulmonary Functions Testing Results: No flowsheet data found.  FeNO:   Pathology:   Echocardiogram:   Heart Catheterization:     Assessment & Plan:     ICD-10-CM   1. Lung nodule  R91.1 Ambulatory referral to Pulmonology    Procedural/ Surgical Case Request: VIDEO BRONCHOSCOPY WITH ENDOBRONCHIAL NAVIGATION    2. History of breast cancer  Z85.3     3. History of thyroid cancer  Z85.850     4. Family history of malignant neoplasm of breast  Z80.3       Discussion: This is a 45 year old female, history of breast cancer status post bilateral mastectomy, history of thyroid cancer status post thyroidectomy.  Found to have an incidental right upper lobe macrolobulated 1 cm pulmonary nodule concerning for malignancy she had had subsequent CT imaging this is relatively been stable however due to its morphology and location there was concern of potential malignancy in the setting of her history.  Here  today to discuss biopsy.  Plan: Today in the office we discussed the patient's risk benefits and alternatives of proceeding with robotic assisted navigational bronchoscopy. Patient is agreeable to proceed. We discussed the risk of bleeding and pneumothorax. We will plan for this to be done on 08/29/2021. Nothing to eat past midnight the night before. We appreciate the PCC's help  with scheduling. Will also ensure a follow-up the following week discuss next steps as well as biopsy results   Current Outpatient Medications:    Calcium Citrate-Vitamin D (CITRACAL + D PO), Take 1 tablet by mouth daily., Disp: , Rfl:    cetirizine (ZYRTEC) 10 MG tablet, Take 10 mg by mouth daily., Disp: , Rfl:    Cholecalciferol (VITAMIN D PO), Take 5,000 Units by mouth daily., Disp: , Rfl:    cholestyramine (QUESTRAN) 4 G packet, Take 4 g by mouth daily., Disp: , Rfl:    levothyroxine (SYNTHROID) 100 MCG tablet, Take 100 mcg by mouth daily before breakfast., Disp: , Rfl:    liothyronine (CYTOMEL) 5 MCG tablet, Take 1 tablet by mouth 2 (two) times daily., Disp: , Rfl: 4   LORazepam (ATIVAN) 1 MG tablet, Take 1 mg by mouth every 8 (eight) hours., Disp: , Rfl:    Multiple Vitamin (MULTIVITAMIN) tablet, Take 1 tablet by mouth daily., Disp: , Rfl:   I spent 62 minutes dedicated to the care of this patient on the date of this encounter to include pre-visit review of records, face-to-face time with the patient discussing conditions above, post visit ordering of testing, clinical documentation with the electronic health record, making appropriate referrals as documented, and communicating necessary findings to members of the patients care team.   [name], DO [facility] 08/16/2021 11:34 AM

## 2021-08-16 NOTE — Patient Instructions (Signed)
Thank you for visiting Dr. [name] at [facility]. Today we recommend the following:  Orders Placed This Encounter  Procedures   Procedural/ Surgical Case Request: [place]   Ambulatory referral to Pulmonology   Tentative Bronchoscopy Date on 08/29/2021 Nothing to eat or drink past midnight the night before.   Return in about 19 days (around 09/04/2021) for with APP, to discuss bronchoscopy results .    Please do your part to reduce the spread of COVID-19.

## 2021-08-17 ENCOUNTER — Telehealth: Payer: Self-pay | Admitting: Pulmonary Disease

## 2021-08-23 NOTE — Telephone Encounter (Signed)
Pt has her bronch info

## 2021-08-25 ENCOUNTER — Other Ambulatory Visit: Payer: Self-pay

## 2021-08-25 ENCOUNTER — Encounter (HOSPITAL_COMMUNITY): Payer: Self-pay | Admitting: Pulmonary Disease

## 2021-08-25 NOTE — Pre-Procedure Instructions (Signed)
PCP - Dr. [name] Cardiologist - denies  PPM/ICD - denies   Chest x-ray - 07/17/2007 EKG - denies Stress Test - denies ECHO - denies Cardiac Cath - denies  OSA- denies  DM- denies  Blood Thinner Instructions: n/a Aspirin Instructions: n/a  ERAS Protcol - NPO  COVID TEST- Pt going for testing on Monday 11/28  Anesthesia review: no  Patient verbally denies any shortness of breath, fever, cough and chest pain during phone call   -------------  SDW INSTRUCTIONS given:  Your procedure is scheduled on Tuesday, 08/29/21.  Report to [facility] Entrance "A" at 12:00 P.M., and check in at the Admitting office.  Call this number if you have problems the morning of surgery:  [phone]   Remember:  Do not eat or drink after midnight the night before your surgery     Take these medicines the morning of surgery with A SIP OF WATER  acetaminophen (TYLENOL)- PRN cetirizine (ZYRTEC)- PRN Famotidine (PEPCID AC MAXIMUM STRENGTH PO) levothyroxine (SYNTHROID) liothyronine (CYTOMEL) LORazepam (ATIVAN)- PRN  As of today, STOP taking any Aspirin (unless otherwise instructed by your surgeon) Aleve, Naproxen, Ibuprofen, Motrin, Advil, Goody's, BC's, all herbal medications, fish oil, and all vitamins.                      Do not wear jewelry, make up, or nail polish            Do not wear lotions, powders, perfumes, or deodorant.            Do not shave 48 hours prior to surgery.             Do not bring valuables to the hospital.            [facility] is not responsible for any belongings or valuables.  Do NOT Smoke (Tobacco/Vaping) or drink Alcohol 24 hours prior to your procedure If you use a CPAP at night, you may bring all equipment for your overnight stay.   Contacts, glasses, dentures or bridgework may not be worn into surgery.      For patients admitted to the hospital, discharge time will be determined by your treatment team.   Patients discharged the day of  surgery will not be allowed to drive home, and someone needs to stay with them for 24 hours.    Special instructions:   [place]- Preparing For Surgery  Before surgery, you can play an important role. Because skin is not sterile, your skin needs to be as free of germs as possible. You can reduce the number of germs on your skin by washing with CHG (chlorahexidine gluconate) Soap before surgery.  CHG is an antiseptic cleaner which kills germs and bonds with the skin to continue killing germs even after washing.    Oral Hygiene is also important to reduce your risk of infection.  Remember - BRUSH YOUR TEETH THE MORNING OF SURGERY WITH YOUR REGULAR TOOTHPASTE  Please do not use if you have an allergy to CHG or antibacterial soaps. If your skin becomes reddened/irritated stop using the CHG.  Do not shave (including legs and underarms) for at least 48 hours prior to first CHG shower. It is OK to shave your face.  Please follow these instructions carefully.   Shower the NIGHT BEFORE SURGERY and the MORNING OF SURGERY with DIAL Soap.   Pat yourself dry with a CLEAN TOWEL.  Wear CLEAN PAJAMAS to bed the night before surgery  Place CLEAN SHEETS on your bed the night of your first shower and DO NOT SLEEP WITH PETS.   Day of Surgery: Please shower morning of surgery  Wear Clean/Comfortable clothing the morning of surgery Do not apply any deodorants/lotions.   Remember to brush your teeth WITH YOUR REGULAR TOOTHPASTE.   Questions were answered. Patient verbalized understanding of instructions.

## 2021-08-28 ENCOUNTER — Other Ambulatory Visit: Payer: Self-pay | Admitting: Pulmonary Disease

## 2021-08-28 LAB — SARS CORONAVIRUS 2 (TAT 6-24 HRS): SARS Coronavirus 2: NEGATIVE

## 2021-08-29 ENCOUNTER — Other Ambulatory Visit: Payer: Self-pay

## 2021-08-29 ENCOUNTER — Encounter (HOSPITAL_COMMUNITY)
Admission: RE | Disposition: A | Discharge status: Home / Self Care | Payer: Self-pay | Source: Home / Self Care | Attending: Pulmonary Disease

## 2021-08-29 ENCOUNTER — Ambulatory Visit (HOSPITAL_COMMUNITY): Payer: No Typology Code available for payment source | Admitting: Anesthesiology

## 2021-08-29 ENCOUNTER — Ambulatory Visit (HOSPITAL_COMMUNITY): Payer: No Typology Code available for payment source

## 2021-08-29 ENCOUNTER — Ambulatory Visit (HOSPITAL_COMMUNITY)
Admission: RE | Admit: 2021-08-29 | Discharge: 2021-08-29 | Disposition: A | Discharge status: Home / Self Care | Payer: No Typology Code available for payment source | Attending: Pulmonary Disease | Admitting: Pulmonary Disease

## 2021-08-29 ENCOUNTER — Encounter (HOSPITAL_COMMUNITY): Payer: Self-pay | Admitting: Pulmonary Disease

## 2021-08-29 DIAGNOSIS — C3411 Malignant neoplasm of upper lobe, right bronchus or lung: Secondary | ICD-10-CM | POA: Insufficient documentation

## 2021-08-29 DIAGNOSIS — Z87891 Personal history of nicotine dependence: Secondary | ICD-10-CM | POA: Diagnosis not present

## 2021-08-29 DIAGNOSIS — Z853 Personal history of malignant neoplasm of breast: Secondary | ICD-10-CM | POA: Insufficient documentation

## 2021-08-29 DIAGNOSIS — R911 Solitary pulmonary nodule: Secondary | ICD-10-CM

## 2021-08-29 DIAGNOSIS — Z419 Encounter for procedure for purposes other than remedying health state, unspecified: Secondary | ICD-10-CM

## 2021-08-29 DIAGNOSIS — F419 Anxiety disorder, unspecified: Secondary | ICD-10-CM | POA: Diagnosis not present

## 2021-08-29 DIAGNOSIS — Z85850 Personal history of malignant neoplasm of thyroid: Secondary | ICD-10-CM | POA: Insufficient documentation

## 2021-08-29 DIAGNOSIS — Z9889 Other specified postprocedural states: Secondary | ICD-10-CM

## 2021-08-29 DIAGNOSIS — Z9013 Acquired absence of bilateral breasts and nipples: Secondary | ICD-10-CM | POA: Insufficient documentation

## 2021-08-29 DIAGNOSIS — E890 Postprocedural hypothyroidism: Secondary | ICD-10-CM | POA: Insufficient documentation

## 2021-08-29 HISTORY — PX: BRONCHIAL NEEDLE ASPIRATION BIOPSY: SHX5106

## 2021-08-29 HISTORY — PX: BRONCHIAL BRUSHINGS: SHX5108

## 2021-08-29 HISTORY — PX: BRONCHIAL BIOPSY: SHX5109

## 2021-08-29 HISTORY — PX: FIDUCIAL MARKER PLACEMENT: SHX6858

## 2021-08-29 LAB — CBC
HCT: 37.6 % (ref 36.0–46.0)
Hemoglobin: 12 g/dL (ref 12.0–15.0)
MCH: 26.7 pg (ref 26.0–34.0)
MCHC: 31.9 g/dL (ref 30.0–36.0)
MCV: 83.6 fL (ref 80.0–100.0)
Platelets: 296 10*3/uL (ref 150–400)
RBC: 4.5 MIL/uL (ref 3.87–5.11)
RDW: 14.6 % (ref 11.5–15.5)
WBC: 7.4 10*3/uL (ref 4.0–10.5)
nRBC: 0 % (ref 0.0–0.2)

## 2021-08-29 SURGERY — BRONCHOSCOPY, WITH BIOPSY USING ELECTROMAGNETIC NAVIGATION
Anesthesia: General | Laterality: Right

## 2021-08-29 MED ORDER — CHLORHEXIDINE GLUCONATE 0.12 % MT SOLN
15.0000 mL | Freq: Once | OROMUCOSAL | Status: AC
  Administered 2021-08-29: 15 mL via OROMUCOSAL
  Filled 2021-08-29: qty 15

## 2021-08-29 MED ORDER — PROPOFOL 10 MG/ML IV BOLUS
INTRAVENOUS | Status: DC | PRN
  Administered 2021-08-29: 50 mg via INTRAVENOUS
  Administered 2021-08-29: 150 mg via INTRAVENOUS

## 2021-08-29 MED ORDER — ROCURONIUM BROMIDE 10 MG/ML (PF) SYRINGE
PREFILLED_SYRINGE | INTRAVENOUS | Status: DC | PRN
  Administered 2021-08-29: 60 mg via INTRAVENOUS

## 2021-08-29 MED ORDER — LACTATED RINGERS IV SOLN: INTRAVENOUS | Status: DC

## 2021-08-29 MED ORDER — CHLORHEXIDINE GLUCONATE 0.12 % MT SOLN
OROMUCOSAL | Status: AC
  Filled 2021-08-29: qty 15

## 2021-08-29 MED ORDER — KETOROLAC TROMETHAMINE 30 MG/ML IJ SOLN
30.0000 mg | Freq: Once | INTRAMUSCULAR | Status: DC | PRN
  Filled 2021-08-29: qty 1

## 2021-08-29 MED ORDER — ACETAMINOPHEN 160 MG/5ML PO SOLN
325.0000 mg | ORAL | Status: DC | PRN
  Filled 2021-08-29: qty 20.3

## 2021-08-29 MED ORDER — ACETAMINOPHEN 325 MG PO TABS
325.0000 mg | ORAL_TABLET | ORAL | Status: DC | PRN
  Filled 2021-08-29: qty 2

## 2021-08-29 MED ORDER — SUGAMMADEX SODIUM 200 MG/2ML IV SOLN
INTRAVENOUS | Status: DC | PRN
  Administered 2021-08-29: 200 mg via INTRAVENOUS

## 2021-08-29 MED ORDER — MEPERIDINE HCL 100 MG/ML IJ SOLN: 6.2500 mg | INTRAMUSCULAR | Status: DC | PRN

## 2021-08-29 MED ORDER — FENTANYL CITRATE (PF) 100 MCG/2ML IJ SOLN: 25.0000 ug | INTRAMUSCULAR | Status: DC | PRN

## 2021-08-29 MED ORDER — PROMETHAZINE HCL 25 MG/ML IJ SOLN: 6.2500 mg | INTRAMUSCULAR | Status: DC | PRN

## 2021-08-29 MED ORDER — LIDOCAINE 2% (20 MG/ML) 5 ML SYRINGE
INTRAMUSCULAR | Status: DC | PRN
  Administered 2021-08-29: 60 mg via INTRAVENOUS

## 2021-08-29 SURGICAL SUPPLY — 1 items: superlock-  fiducial marker ×1 IMPLANT

## 2021-08-29 NOTE — Anesthesia Procedure Notes (Signed)
Procedure Name: Intubation Date/Time: 08/29/2021 2:36 PM Performed by: Georgia Duff, CRNA Pre-anesthesia Checklist: Patient identified, Emergency Drugs available, Suction available and Patient being monitored Patient Re-evaluated:Patient Re-evaluated prior to induction Oxygen Delivery Method: Circle System Utilized Preoxygenation: Pre-oxygenation with 100% oxygen Induction Type: IV induction Ventilation: Mask ventilation without difficulty Laryngoscope Size: Mac and 3 Grade View: Grade I Tube type: Oral Tube size: 8.5 mm Number of attempts: 1 Airway Equipment and Method: Stylet and Oral airway Placement Confirmation: ETT inserted through vocal cords under direct vision, positive ETCO2 and breath sounds checked- equal and bilateral Secured at: 22 cm Tube secured with: Tape Dental Injury: Teeth and Oropharynx as per pre-operative assessment

## 2021-08-29 NOTE — Interval H&P Note (Signed)
History and Physical Interval Note:  08/29/2021 12:51 PM  Dawna L Hargens  has presented today for surgery, with the diagnosis of Lung nodule.  The various methods of treatment have been discussed with the patient and family. After consideration of risks, benefits and other options for treatment, the patient has consented to  Procedure(s) with comments: ROBOTIC ASSISTED NAVIGATIONAL BRONCHOSCOPY (Right) - ION w/ CIOS as a surgical intervention.  The patient's history has been reviewed, patient examined, no change in status, stable for surgery.  I have reviewed the patient's chart and labs.  Questions were answered to the patient's satisfaction.     [place]

## 2021-08-29 NOTE — Anesthesia Preprocedure Evaluation (Signed)
Anesthesia Evaluation  Patient identified by MRN, date of birth, ID band Patient awake    Reviewed: Allergy & Precautions, NPO status , Patient's Chart, lab work & pertinent test results  Airway Mallampati: I       Dental no notable dental hx.    Pulmonary neg pulmonary ROS, former smoker,    Pulmonary exam normal        Cardiovascular negative cardio ROS Normal cardiovascular exam     Neuro/Psych Anxiety negative neurological ROS     GI/Hepatic negative GI ROS, Neg liver ROS,   Endo/Other  negative endocrine ROS  Renal/GU negative Renal ROS  negative genitourinary   Musculoskeletal negative musculoskeletal ROS (+)   Abdominal Normal abdominal exam  (+)   Peds  Hematology negative hematology ROS (+)   Anesthesia Other Findings   Reproductive/Obstetrics                             Anesthesia Physical Anesthesia Plan  ASA: 2  Anesthesia Plan: General   Post-op Pain Management:    Induction:   PONV Risk Score and Plan: 2 and Ondansetron and Midazolam  Airway Management Planned: Oral ETT  Additional Equipment: None  Intra-op Plan:   Post-operative Plan: Extubation in OR  Informed Consent: I have reviewed the patients History and Physical, chart, labs and discussed the procedure including the risks, benefits and alternatives for the proposed anesthesia with the patient or authorized representative who has indicated his/her understanding and acceptance.     Dental advisory given  Plan Discussed with: CRNA  Anesthesia Plan Comments:         Anesthesia Quick Evaluation

## 2021-08-29 NOTE — Transfer of Care (Signed)
Immediate Anesthesia Transfer of Care Note  Patient: Bonnie Harper  Procedure(s) Performed: ROBOTIC ASSISTED NAVIGATIONAL BRONCHOSCOPY (Right) BRONCHIAL NEEDLE ASPIRATION BIOPSIES BRONCHIAL BIOPSIES BRONCHIAL BRUSHINGS FIDUCIAL MARKER PLACEMENT  Patient Location: PACU  Anesthesia Type:General  Level of Consciousness: drowsy and patient cooperative  Airway & Oxygen Therapy: Patient Spontanous Breathing and Patient connected to nasal cannula oxygen  Post-op Assessment: Report given to RN and Post -op Vital signs reviewed and stable  Post vital signs: Reviewed and stable  Last Vitals:  Vitals Value Taken Time  BP 134/92 08/29/21 1536  Temp 37.1 C 08/29/21 1536  Pulse 109 08/29/21 1543  Resp 16 08/29/21 1543  SpO2 97 % 08/29/21 1543  Vitals shown include unvalidated device data.  Last Pain:  Vitals:   08/29/21 1221  TempSrc:   PainSc: 0-No pain         Complications: No notable events documented.

## 2021-08-29 NOTE — Discharge Instructions (Signed)
Flexible Bronchoscopy, Care After This sheet gives you information about how to care for yourself after your test. Your doctor may also give you more specific instructions. If you have problems or questions, contact your doctor. Follow these instructions at home: Eating and drinking Do not eat or drink anything (not even water) for 2 hours after your test, or until your numbing medicine (local anesthetic) wears off. When your numbness is gone and your cough and gag reflexes have come back, you may: Eat only soft foods. Slowly drink liquids. The day after the test, go back to your normal diet. Driving Do not drive for 24 hours if you were given a medicine to help you relax (sedative). Do not drive or use heavy machinery while taking prescription pain medicine. General instructions  Take over-the-counter and prescription medicines only as told by your doctor. Return to your normal activities as told. Ask what activities are safe for you. Do not use any products that have nicotine or tobacco in them. This includes cigarettes and e-cigarettes. If you need help quitting, ask your doctor. Keep all follow-up visits as told by your doctor. This is important. It is very important if you had a tissue sample (biopsy) taken. Get help right away if: You have shortness of breath that gets worse. You get light-headed. You feel like you are going to pass out (faint). You have chest pain. You cough up: More than a little blood. More blood than before. Summary Do not eat or drink anything (not even water) for 2 hours after your test, or until your numbing medicine wears off. Do not use cigarettes. Do not use e-cigarettes. Get help right away if you have chest pain.  This information is not intended to replace advice given to you by your health care provider. Make sure you discuss any questions you have with your health care provider. Document Released: 07/15/2009 Document Revised: 08/30/2017 Document  Reviewed: 10/05/2016 Elsevier Patient Education  2020 [employer].

## 2021-08-29 NOTE — Op Note (Signed)
Video Bronchoscopy with Robotic Assisted Bronchoscopic Navigation   Date of Operation: 08/29/2021   Pre-op Diagnosis: Lung nodule   Post-op Diagnosis: Lung nodule   Surgeon: [name], DO   Assistants: None   Anesthesia: General endotracheal anesthesia  Operation: Flexible video fiberoptic bronchoscopy with robotic assistance and biopsies.  Estimated Blood Loss: Minimal  Complications: None  Indications and History: Bonnie Harper is a 45 y.o. female with history of breast cancer, thyroid cancer, now with a lung nodule. The risks, benefits, complications, treatment options and expected outcomes were discussed with the patient.  The possibilities of pneumothorax, pneumonia, reaction to medication, pulmonary aspiration, perforation of a viscus, bleeding, failure to diagnose a condition and creating a complication requiring transfusion or operation were discussed with the patient who freely signed the consent.    Description of Procedure: The patient was seen in the Preoperative Area, was examined and was deemed appropriate to proceed.  The patient was taken to [facility] endoscopy room 3, identified as Bonnie Harper and the procedure verified as Flexible Video Fiberoptic Bronchoscopy.  A Time Out was held and the above information confirmed.   Prior to the date of the procedure a high-resolution CT scan of the chest was performed. Utilizing ION software program a virtual tracheobronchial tree was generated to allow the creation of distinct navigation pathways to the patient's parenchymal abnormalities. After being taken to the operating room general anesthesia was initiated and the patient  was orally intubated. The video fiberoptic bronchoscope was introduced via the endotracheal tube and a general inspection was performed which showed normal right and left lung anatomy, aspiration of the bilateral mainstems was completed to remove any remaining secretions. Robotic catheter inserted into  patient's endotracheal tube.   Target #1 right upper lobe lung nodule: The distinct navigation pathways prepared prior to this procedure were then utilized to navigate to patient's lesion identified on CT scan. The robotic catheter was secured into place and the vision probe was withdrawn.  Lesion location was approximated using fluoroscopy, radial endobronchial ultrasound, and three-dimensional cone beam CT imaging for peripheral targeting. Under fluoroscopic guidance transbronchial needle brushings, transbronchial needle biopsies, and transbronchial forceps biopsies were performed to be sent for cytology and pathology.  Following tissue sampling a single fiducial was placed using fluoroscopic guidance with fiducial catheter wire delivery.  At the end of the procedure a general airway inspection was performed and there was no evidence of active bleeding. The bronchoscope was removed.  The patient tolerated the procedure well. There was no significant blood loss and there were no obvious complications. A post-procedural chest x-ray is pending.  Samples Target #1: 1. Transbronchial needle brushings from RUL 2. Transbronchial Wang needle biopsies from RUL 3. Transbronchial forceps biopsies from RUL  Plans:  The patient will be discharged from the PACU to home when recovered from anesthesia and after chest x-ray is reviewed. We will review the cytology, pathology and microbiology results with the patient when they become available. Outpatient followup will be with [name], DO.  [name], DO [facility] 08/29/2021 3:39 PM

## 2021-08-31 ENCOUNTER — Encounter (HOSPITAL_COMMUNITY): Payer: Self-pay | Admitting: Pulmonary Disease

## 2021-08-31 NOTE — Anesthesia Postprocedure Evaluation (Signed)
Anesthesia Post Note  Patient: Bonnie Harper  Procedure(s) Performed: ROBOTIC ASSISTED NAVIGATIONAL BRONCHOSCOPY (Right) BRONCHIAL NEEDLE ASPIRATION BIOPSIES BRONCHIAL BIOPSIES BRONCHIAL BRUSHINGS FIDUCIAL MARKER PLACEMENT     Patient location during evaluation: PACU Anesthesia Type: General Level of consciousness: sedated and patient cooperative Pain management: pain level controlled Vital Signs Assessment: post-procedure vital signs reviewed and stable Respiratory status: spontaneous breathing Cardiovascular status: stable Anesthetic complications: no   No notable events documented.  Last Vitals:  Vitals:   08/29/21 1551 08/29/21 1607  BP: 134/85 133/90  Pulse: (!) 104 99  Resp: 14 14  Temp:  36.5 C  SpO2: 99% 96%    Last Pain:  Vitals:   08/29/21 1607  TempSrc:   PainSc: 0-No pain                 Nolon Nations

## 2021-09-04 ENCOUNTER — Encounter: Payer: Self-pay | Admitting: Pulmonary Disease

## 2021-09-04 ENCOUNTER — Other Ambulatory Visit: Payer: Self-pay

## 2021-09-04 ENCOUNTER — Ambulatory Visit (INDEPENDENT_AMBULATORY_CARE_PROVIDER_SITE_OTHER): Payer: No Typology Code available for payment source | Admitting: Pulmonary Disease

## 2021-09-04 VITALS — BP 124/80 | HR 79 | Temp 97.8°F | Ht 64.0 in | Wt 178.8 lb

## 2021-09-04 DIAGNOSIS — C3411 Malignant neoplasm of upper lobe, right bronchus or lung: Secondary | ICD-10-CM

## 2021-09-04 DIAGNOSIS — Z85850 Personal history of malignant neoplasm of thyroid: Secondary | ICD-10-CM

## 2021-09-04 DIAGNOSIS — Z853 Personal history of malignant neoplasm of breast: Secondary | ICD-10-CM

## 2021-09-04 DIAGNOSIS — Z803 Family history of malignant neoplasm of breast: Secondary | ICD-10-CM

## 2021-09-04 NOTE — Progress Notes (Signed)
Synopsis: Referred in November 2022 for lung nodule by [name], MD  Subjective:   PATIENT ID: Bonnie Harper GENDER: female DOB: 1976-07-07, MRN: [id number]  Chief Complaint  Patient presents with   Follow-up    Follow up on bronch results     This is a 45 year old female, past medical history of breast cancer, right breast status post bilateral mastectomy 2008, history of thyroid cancer status post thyroidectomy in 2011. Patient had a CT scan of the chest on 08/03/2021.  This revealed a right upper lobe 1 cm irregular spiculated nodule.  Also found to have a smaller 4 mm subpleural nodule and an additional left lower lobe 3 mm subpleural nodule.  Due to the patient's history of breast and thyroid cancer there was recommendations for consideration of PET scan versus tissue biopsy.  However back in August 2022 patient did have a PET scan.  There was low-level activity within the dominant right upper lobe pulmonary nodule at that time which recommended a dedicated 35-month CT scan follow-up.  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  She has a small smoking history from age [age] where she smoked just a few cigarettes a day.  In addition to the lung nodule she does have complaints of cough.  They did get a new cat this summer.  Her cough is random but she does state that has been new.  Wondering if the nodule could be related.  OV 08/16/2021 here today to discuss CT scan and considerations for tissue sampling or biopsy as needed.  OV 09/04/2021: Here today to follow-up after bronchoscopy. Right upper lobe nodule biopsy consistent with adenocarcinoma.  Here today to discuss bronchoscopy results.  Husband present as well.   Past Medical History:  Diagnosis Date   Anxiety    Breast cancer ([place]) 02/28/2007   Right breast s/p bilat mastectomies   Cancer ([place]) 11/03/2009   Thyroid cancer s/p thyroidectomy    IBS (irritable bowel syndrome)    Thyroid  disease      Family History  Problem Relation Age of Onset   Stroke Father 18   Diabetes Father    Asthma Father    Cancer Maternal Uncle 79       melanoma   Cancer Maternal Grandmother 46       bilateral breast cancer   Skin cancer Maternal Grandmother        BCC, SCC, and melanoma    Cancer Other 54       mat great aunt through [facility] with breast cancer   Cancer Other 56       maternal great aunt through [facility] with breast cancer at age 39, ovarian ca at 80 and 2nd breast primary in 81s   Lung cancer Paternal Uncle 76   Cancer Paternal Grandfather        oral; dx late 65s     Past Surgical History:  Procedure Laterality Date   BREAST RECONSTRUCTION Bilateral    BRONCHIAL BIOPSY  08/29/2021   Procedure: BRONCHIAL BIOPSIES;  Surgeon: [name], DO;  Location: [facility];  Service: Pulmonary;;   BRONCHIAL BRUSHINGS  08/29/2021   Procedure: BRONCHIAL BRUSHINGS;  Surgeon: [name], DO;  Location: [facility];  Service: Pulmonary;;   BRONCHIAL NEEDLE ASPIRATION BIOPSY  08/29/2021   Procedure: BRONCHIAL NEEDLE ASPIRATION BIOPSIES;  Surgeon: [name], DO;  Location: [facility];  Service: Pulmonary;;   CESAREAN SECTION     C-section x 2  CHOLECYSTECTOMY     FIDUCIAL MARKER PLACEMENT  08/29/2021   Procedure: FIDUCIAL MARKER PLACEMENT;  Surgeon: [name], DO;  Location: [place] [facility];  Service: Pulmonary;;   IMPLANT OF A SILASTIC METATARSAL PHALANGE JOINT Left 2021   3 titanium rods in joint   MASTECTOMY Bilateral 08/14/2007   PORT-A-CATH REMOVAL  08/14/2007   THYROIDECTOMY  11/03/2009   TONSILLECTOMY     removed as a child    Social History   Socioeconomic History   Marital status: Married    Spouse name: Not on file   Number of children: 2   Years of education: Not on file   Highest education level: Not on file  Occupational History   Not on file  Tobacco Use   Smoking status: Former    Types: Cigarettes    Quit date: 04/04/2002     Years since quitting: 19.4   Smokeless tobacco: Never  Vaping Use   Vaping Use: Never used  Substance and Sexual Activity   Alcohol use: Yes    Alcohol/week: 2.0 standard drinks    Types: 2 Glasses of wine per week   Drug use: No   Sexual activity: Yes    Birth control/protection: Other-see comments    Comment: Husband is s/p vasectomy  Other Topics Concern   Not on file  Social History Narrative   Not on file   Social Determinants of Health   Financial Resource Strain: Not on file  Food Insecurity: Not on file  Transportation Needs: Not on file  Physical Activity: Not on file  Stress: Not on file  Social Connections: Not on file  Intimate Partner Violence: Not on file     Allergies  Allergen Reactions   Zofran Other (See Comments)    migraines   Sertraline Other (See Comments)    Migraines     Outpatient Medications Prior to Visit  Medication Sig Dispense Refill   acetaminophen (TYLENOL) 325 MG tablet Take 325 mg by mouth every 4 (four) hours as needed for headache, mild pain or moderate pain.     cetirizine (ZYRTEC) 10 MG tablet Take 10 mg by mouth daily as needed for allergies.     Cholecalciferol (VITAMIN D PO) Take 5,000 Units by mouth daily.     cholestyramine (QUESTRAN) 4 G packet Take 4 g by mouth daily.     Famotidine (PEPCID AC MAXIMUM STRENGTH PO) Take 10 mg by mouth daily.     levothyroxine (SYNTHROID) 112 MCG tablet Take 112 mcg by mouth daily before breakfast.     liothyronine (CYTOMEL) 5 MCG tablet Take 5 mcg by mouth daily.  4   LORazepam (ATIVAN) 1 MG tablet Take 1 mg by mouth daily as needed for anxiety.     Magnesium 250 MG TABS Take 250 mg by mouth daily.     naproxen sodium (ALEVE) 220 MG tablet Take 220 mg by mouth every 12 (twelve) hours as needed (Headache and back pain).     vitamin C (ASCORBIC ACID) 500 MG tablet Take 500 mg by mouth daily.     No facility-administered medications prior to visit.    Review of Systems  Constitutional:   Negative for chills, fever, malaise/fatigue and weight loss.  HENT:  Negative for hearing loss, sore throat and tinnitus.   Eyes:  Negative for blurred vision and double vision.  Respiratory:  Negative for cough, hemoptysis, sputum production, shortness of breath, wheezing and stridor.   Cardiovascular:  Negative for chest pain, palpitations, orthopnea,  leg swelling and PND.  Gastrointestinal:  Negative for abdominal pain, constipation, diarrhea, heartburn, nausea and vomiting.  Genitourinary:  Negative for dysuria, hematuria and urgency.  Musculoskeletal:  Negative for joint pain and myalgias.  Skin:  Negative for itching and rash.  Neurological:  Negative for dizziness, tingling, weakness and headaches.  Endo/Heme/Allergies:  Negative for environmental allergies. Does not bruise/bleed easily.  Psychiatric/Behavioral:  Negative for depression. The patient is not nervous/anxious and does not have insomnia.   All other systems reviewed and are negative.   Objective:  Physical Exam Vitals reviewed.  Constitutional:      General: She is not in acute distress.    Appearance: She is well-developed.  HENT:     Head: Normocephalic and atraumatic.  Eyes:     General: No scleral icterus.    Conjunctiva/sclera: Conjunctivae normal.     Pupils: Pupils are equal, round, and reactive to light.  Neck:     Vascular: No JVD.     Trachea: No tracheal deviation.  Cardiovascular:     Rate and Rhythm: Normal rate and regular rhythm.     Heart sounds: Normal heart sounds. No murmur heard. Pulmonary:     Effort: Pulmonary effort is normal. No tachypnea, accessory muscle usage or respiratory distress.     Breath sounds: No stridor. No wheezing, rhonchi or rales.  Abdominal:     General: There is no distension.     Palpations: Abdomen is soft.     Tenderness: There is no abdominal tenderness.  Musculoskeletal:        General: No tenderness.     Cervical back: Neck supple.  Lymphadenopathy:      Cervical: No cervical adenopathy.  Skin:    General: Skin is warm and dry.     Capillary Refill: Capillary refill takes less than 2 seconds.     Findings: No rash.  Neurological:     Mental Status: She is alert and oriented to person, place, and time.  Psychiatric:        Behavior: Behavior normal.     Vitals:   09/04/21 0859  BP: 124/80  Pulse: 79  Temp: 97.8 F (36.6 C)  TempSrc: Oral  SpO2: 100%  Weight: 178 lb 12.8 oz (81.1 kg)  Height: 5\' 4"  (1.626 m)    100% on RA BMI Readings from Last 3 Encounters:  09/04/21 30.69 kg/m  08/29/21 30.55 kg/m  08/16/21 30.66 kg/m   Wt Readings from Last 3 Encounters:  09/04/21 178 lb 12.8 oz (81.1 kg)  08/29/21 178 lb (80.7 kg)  08/16/21 178 lb 9.6 oz (81 kg)     CBC    Component Value Date/Time   WBC 7.4 08/29/2021 1209   RBC 4.50 08/29/2021 1209   HGB 12.0 08/29/2021 1209   HGB 13.9 09/06/2016 0949   HCT 37.6 08/29/2021 1209   HCT 41.0 09/06/2016 0949   PLT 296 08/29/2021 1209   PLT 272 09/06/2016 0949   MCV 83.6 08/29/2021 1209   MCV 93.3 09/06/2016 0949   MCH 26.7 08/29/2021 1209   MCHC 31.9 08/29/2021 1209   RDW 14.6 08/29/2021 1209   RDW 12.1 09/06/2016 0949   LYMPHSABS 1.0 09/06/2016 0949   MONOABS 0.6 09/06/2016 0949   EOSABS 0.1 09/06/2016 0949   BASOSABS 0.0 09/06/2016 0949    Chest Imaging: 08/02/2021 CT chest: History of breast cancer and thyroid cancer.  Found to have a right upper lobe macrolobulated nodule.  Nodule was demonstrated initially in 02/27/2019 CT.  She also has multiple pulmonary nodules however the largest nodules about 1 cm irregular shaped spiculated margins, macrolobulated.  PET CT imaging was completed in August 2022 which showed low-level uptake. The patient's images have been independently reviewed by me.    Pulmonary Functions Testing Results: No flowsheet data found.  FeNO:   Pathology:   Echocardiogram:   Heart Catheterization:     Assessment & Plan:     ICD-10-CM    1. Primary adenocarcinoma of upper lobe of right lung [facility])  C34.11 Pulmonary Function Test    Ambulatory referral to Cardiothoracic Surgery    2. History of breast cancer  Z85.3     3. History of thyroid cancer  Z85.850     4. Family history of malignant neoplasm of breast  Z80.3      Discussion: This is a 45 year old female, history of breast cancer status post bilateral mastectomy, history of thyroid cancer status post thyroidectomy, found to have an incidental right upper lobe macrolobulated 1 cm pulmonary nodule taken for bronchoscopy tissue sampling confirming adenocarcinoma present.  Plan: We will get pulmonary function test and referral placed to cardiothoracic surgery to consider resection of the lesion. Unfortunately was not on material for ancillary studies but at least we are able to confirm that this small lesion was in fact malignant. She will discuss case with thoracic surgery.  If they feel that this needs to be a combination case with dye marking I am happy to assist.  We appreciate their assistance.    Current Outpatient Medications:    acetaminophen (TYLENOL) 325 MG tablet, Take 325 mg by mouth every 4 (four) hours as needed for headache, mild pain or moderate pain., Disp: , Rfl:    cetirizine (ZYRTEC) 10 MG tablet, Take 10 mg by mouth daily as needed for allergies., Disp: , Rfl:    Cholecalciferol (VITAMIN D PO), Take 5,000 Units by mouth daily., Disp: , Rfl:    cholestyramine (QUESTRAN) 4 G packet, Take 4 g by mouth daily., Disp: , Rfl:    Famotidine (PEPCID AC MAXIMUM STRENGTH PO), Take 10 mg by mouth daily., Disp: , Rfl:    levothyroxine (SYNTHROID) 112 MCG tablet, Take 112 mcg by mouth daily before breakfast., Disp: , Rfl:    liothyronine (CYTOMEL) 5 MCG tablet, Take 5 mcg by mouth daily., Disp: , Rfl: 4   LORazepam (ATIVAN) 1 MG tablet, Take 1 mg by mouth daily as needed for anxiety., Disp: , Rfl:    Magnesium 250 MG TABS, Take 250 mg by mouth daily., Disp: ,  Rfl:    naproxen sodium (ALEVE) 220 MG tablet, Take 220 mg by mouth every 12 (twelve) hours as needed (Headache and back pain)., Disp: , Rfl:    vitamin C (ASCORBIC ACID) 500 MG tablet, Take 500 mg by mouth daily., Disp: , Rfl:     [name], DO [facility] 09/06/2021 6:24 PM

## 2021-09-04 NOTE — Patient Instructions (Signed)
Thank you for visiting Dr. [name] at [facility]. Today we recommend the following:  Orders Placed This Encounter  Procedures   Ambulatory referral to Cardiothoracic Surgery   Pulmonary Function Test   PFTs ASAP  Return in about 3 months (around 12/03/2021) for w/ Dr. [name].    Please do your part to reduce the spread of COVID-19.

## 2021-09-05 ENCOUNTER — Encounter: Payer: Self-pay | Admitting: *Deleted

## 2021-09-05 ENCOUNTER — Telehealth: Payer: Self-pay | Admitting: Pulmonary Disease

## 2021-09-05 NOTE — Telephone Encounter (Signed)
Per Dr [name]- needs PFT asap  Pt fully vaxxed  I set her up for 09/08/21 since we had opening come up  [facility] and also sent mychart msg to confirm she can make this appt.

## 2021-09-08 ENCOUNTER — Other Ambulatory Visit: Payer: Self-pay | Admitting: *Deleted

## 2021-09-08 ENCOUNTER — Ambulatory Visit (INDEPENDENT_AMBULATORY_CARE_PROVIDER_SITE_OTHER): Payer: No Typology Code available for payment source | Admitting: Pulmonary Disease

## 2021-09-08 ENCOUNTER — Other Ambulatory Visit: Payer: Self-pay

## 2021-09-08 DIAGNOSIS — C3411 Malignant neoplasm of upper lobe, right bronchus or lung: Secondary | ICD-10-CM

## 2021-09-08 LAB — PULMONARY FUNCTION TEST
DL/VA % pred: 115 %
DL/VA: 5.02 ml/min/mmHg/L
DLCO cor % pred: 118 %
DLCO cor: 25.86 ml/min/mmHg
DLCO unc % pred: 112 %
DLCO unc: 24.68 ml/min/mmHg
FEF 25-75 Post: 2.86 L/sec
FEF 25-75 Pre: 2.12 L/sec
FEF2575-%Change-Post: 34 %
FEF2575-%Pred-Post: 95 %
FEF2575-%Pred-Pre: 71 %
FEV1-%Change-Post: 6 %
FEV1-%Pred-Post: 97 %
FEV1-%Pred-Pre: 90 %
FEV1-Post: 2.87 L
FEV1-Pre: 2.69 L
FEV1FVC-%Change-Post: 6 %
FEV1FVC-%Pred-Pre: 93 %
FEV6-%Change-Post: 0 %
FEV6-%Pred-Post: 97 %
FEV6-%Pred-Pre: 97 %
FEV6-Post: 3.52 L
FEV6-Pre: 3.52 L
FEV6FVC-%Change-Post: 0 %
FEV6FVC-%Pred-Post: 102 %
FEV6FVC-%Pred-Pre: 102 %
FVC-%Change-Post: 0 %
FVC-%Pred-Post: 96 %
FVC-%Pred-Pre: 95 %
FVC-Post: 3.56 L
FVC-Pre: 3.53 L
Post FEV1/FVC ratio: 81 %
Post FEV6/FVC ratio: 100 %
Pre FEV1/FVC ratio: 76 %
Pre FEV6/FVC Ratio: 100 %
RV % pred: 126 %
RV: 2.16 L
TLC % pred: 110 %
TLC: 5.67 L

## 2021-09-08 LAB — CYTOLOGY - NON PAP

## 2021-09-08 NOTE — Progress Notes (Signed)
The proposed treatment discussed in conference is for discussion purpose only and is not a binding recommendation. The patient was not been physically examined, or presented with their treatment options. Therefore, final treatment plans cannot be decided.  

## 2021-09-08 NOTE — Progress Notes (Signed)
PFT done today. 

## 2021-09-11 NOTE — Progress Notes (Deleted)
Bonnie Harper 411       Bonnie Harper [address]             Bonnie Harper Record #[id number] Date of Birth: 1975/10/30  Referring: [name], MD Primary Care: [name], MD Primary Cardiologist: None  Chief Complaint:   No chief complaint on file.   History of Present Illness:    Bonnie Harper 45 y.o. female ***   This is a 45 year old female, past medical history of breast cancer, right breast status post bilateral mastectomy 2008, history of thyroid cancer status post thyroidectomy in 2011. Patient had a CT scan of the chest on 08/03/2021.  This revealed a right upper lobe 1 cm irregular spiculated nodule.  Also found to have a smaller 4 mm subpleural nodule and an additional left lower lobe 3 mm subpleural nodule.  Due to the patient's history of breast and thyroid cancer there was recommendations for consideration of PET scan versus tissue biopsy.  However back in August 2022 patient did have a PET scan.  There was low-level activity within the dominant right upper lobe pulmonary nodule at that time which recommended a dedicated 67-month CT scan follow-up.  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  She has a small smoking history from age [age] where she smoked just a few cigarettes a day.  In addition to the lung nodule she does have complaints of cough.    Smoking Hx: ***   Zubrod Score: At the time of surgery this patient's most appropriate activity status/level should be described as: []     0    Normal activity, no symptoms []     1    Restricted in physical strenuous activity but ambulatory, able to do out light work []     2    Ambulatory and capable of self care, unable to do work activities, up and about               >50 % of waking hours                              []     3    Only limited self care, in bed greater than 50% of waking hours []     4    Completely  disabled, no self care, confined to bed or chair []     5    Moribund   Past Medical History:  Diagnosis Date   Anxiety    Breast cancer ([place]) 02/28/2007   Right breast s/p bilat mastectomies   Cancer ([place]) 11/03/2009   Thyroid cancer s/p thyroidectomy    IBS (irritable bowel syndrome)    Thyroid disease     Past Surgical History:  Procedure Laterality Date   BREAST RECONSTRUCTION Bilateral    BRONCHIAL BIOPSY  08/29/2021   Procedure: BRONCHIAL BIOPSIES;  Surgeon: [name], DO;  Location: [facility];  Service: Pulmonary;;   BRONCHIAL BRUSHINGS  08/29/2021   Procedure: BRONCHIAL BRUSHINGS;  Surgeon: [name], DO;  Location: [facility];  Service: Pulmonary;;   BRONCHIAL NEEDLE ASPIRATION BIOPSY  08/29/2021   Procedure: BRONCHIAL NEEDLE ASPIRATION BIOPSIES;  Surgeon: [name], DO;  Location: [facility];  Service: Pulmonary;;   CESAREAN SECTION     C-section x 2  CHOLECYSTECTOMY     FIDUCIAL MARKER PLACEMENT  08/29/2021   Procedure: FIDUCIAL MARKER PLACEMENT;  Surgeon: [name], DO;  Location: [facility];  Service: Pulmonary;;   IMPLANT OF A SILASTIC METATARSAL PHALANGE JOINT Left 2021   3 titanium rods in joint   MASTECTOMY Bilateral 08/14/2007   PORT-A-CATH REMOVAL  08/14/2007   THYROIDECTOMY  11/03/2009   TONSILLECTOMY     removed as a child    Family History  Problem Relation Age of Onset   Stroke Father 66   Diabetes Father    Asthma Father    Cancer Maternal Uncle 48       melanoma   Cancer Maternal Grandmother 46       bilateral breast cancer   Skin cancer Maternal Grandmother        BCC, SCC, and melanoma    Cancer Other 77       mat great aunt through [facility] with breast cancer   Cancer Other 10       maternal great aunt through [facility] with breast cancer at age 51, ovarian ca at 84 and 2nd breast primary in 54s   Lung cancer Paternal Uncle 8   Cancer Paternal Grandfather        oral; dx late 64s     Social History    Tobacco Use  Smoking Status Former   Types: Cigarettes   Quit date: 04/04/2002   Years since quitting: 19.4  Smokeless Tobacco Never    Social History   Substance and Sexual Activity  Alcohol Use Yes   Alcohol/week: 2.0 standard drinks   Types: 2 Glasses of wine per week     Allergies  Allergen Reactions   Zofran Other (See Comments)    migraines   Sertraline Other (See Comments)    Migraines    Current Outpatient Medications  Medication Sig Dispense Refill   acetaminophen (TYLENOL) 325 MG tablet Take 325 mg by mouth every 4 (four) hours as needed for headache, mild pain or moderate pain.     cetirizine (ZYRTEC) 10 MG tablet Take 10 mg by mouth daily as needed for allergies.     Cholecalciferol (VITAMIN D PO) Take 5,000 Units by mouth daily.     cholestyramine (QUESTRAN) 4 G packet Take 4 g by mouth daily.     Famotidine (PEPCID AC MAXIMUM STRENGTH PO) Take 10 mg by mouth daily.     levothyroxine (SYNTHROID) 112 MCG tablet Take 112 mcg by mouth daily before breakfast.     liothyronine (CYTOMEL) 5 MCG tablet Take 5 mcg by mouth daily.  4   LORazepam (ATIVAN) 1 MG tablet Take 1 mg by mouth daily as needed for anxiety.     Magnesium 250 MG TABS Take 250 mg by mouth daily.     naproxen sodium (ALEVE) 220 MG tablet Take 220 mg by mouth every 12 (twelve) hours as needed (Headache and back pain).     vitamin C (ASCORBIC ACID) 500 MG tablet Take 500 mg by mouth daily.     No current facility-administered medications for this visit.    ROS   PHYSICAL EXAMINATION: There were no vitals taken for this visit. Physical Exam  Diagnostic Studies & Laboratory data:     Recent Radiology Findings:   DG CHEST PORT 1 VIEW  Result Date: 08/29/2021 CLINICAL DATA:  Status post bronchoscopy with biopsy. EXAM: PORTABLE CHEST 1 VIEW COMPARISON:  CT of the chest 08/02/2021 chest x-ray 07/17/2007. FINDINGS: Surgical clips overlie the  right hilar region and right adnexa. The heart size and  mediastinal contours are within normal limits. Both lungs are clear. The visualized skeletal structures are unremarkable. IMPRESSION: No active disease. Electronically Signed   By: Ronney Asters M.D.   On: 08/29/2021 16:01   DG C-ARM BRONCHOSCOPY  Result Date: 08/29/2021 C-ARM BRONCHOSCOPY: Fluoroscopy was utilized by the requesting physician.  No radiographic interpretation.       I have independently reviewed the above radiology studies  and reviewed the findings with the patient.   Recent Lab Findings: Lab Results  Component Value Date   WBC 7.4 08/29/2021   HGB 12.0 08/29/2021   HCT 37.6 08/29/2021   PLT 296 08/29/2021   GLUCOSE 94 09/06/2016   ALT 19 09/06/2016   AST 17 09/06/2016   NA 141 09/06/2016   K 4.2 09/06/2016   CL 103 06/16/2010   CREATININE 0.8 09/06/2016   BUN 8.1 09/06/2016   CO2 25 09/06/2016   TSH 2.245 07/28/2007   INR 1.00 (L) 04/23/2007     PFTs:  - FVC: 95% - FEV1: 90% -DLCO: 118%  Problem List: 1cm right upper pulmonary adenocarcinoma.  S/p bronch, and immuno consistent with lung primary Hx of breast and thyroid cancer      NECK: Prominent mildly metabolic bilateral cervical lymph nodes for instance there is a left level 1 B lymph node which measures 5 mm on image 28/4 with a max SUV of 2.62 and a left jugular chain lymph node measuring 4 mm on image 25/4 with a max SUV of 3.14, these are nonspecific but favored reactive.   Symmetric hypermetabolic hyperplasia of the tonsils with a max SUV of 6.6, likely reactive.   Incidental CT findings: Dental hardware.  Prior thyroidectomy.   CHEST: Low level hypermetabolic activity in the right upper lobe pulmonary nodule which measures 1 cm on image 31/8 with a max SUV of 1.91.   No additional hypermetabolic pulmonary nodules. No hypermetabolic adenopathy in the chest.   Incidental CT findings: Unchanged size of the additional bilateral pulmonary nodules measuring up to 4 mm in the right  upper lobe. No pleural effusion. No pneumothorax. Normal size heart. No significant pericardial effusion/thickening. Normal caliber thoracic aorta. Surgical change of bilateral mastectomy and axillary lymph node dissection with breast reconstruction.  Assessment / Plan:   45 yo female with right upper lobe adenocarcinoma.  Hx of right breath cancer s/p bilateral mastectomies, and thyroid cancer  Will need to see if she had chest radiation - unlikely. OR for R RATS, RULectomy     I  spent {CHL ONC TIME VISIT - EXNTZ:[id number]} with  the patient face to face in counseling and coordination of care.    [name] 09/11/2021 7:21 AM

## 2021-09-12 ENCOUNTER — Encounter: Payer: No Typology Code available for payment source | Admitting: Thoracic Surgery (Cardiothoracic Vascular Surgery)

## 2021-09-14 NOTE — Progress Notes (Signed)
[name]       [name] [address]             [phone]                    Bonnie Harper [name] #[id number] Date of Birth: 1976/06/01  Referring: [name], DO Primary Care: [name], MD Primary Cardiologist: None  Chief Complaint:    Chief Complaint  Patient presents with   Lung Lesion    Surgical consult, Chest CT 08/02/21, PFT's 09/08/21, Bronch 08/29/21, PET Scan 05/17/21    History of Present Illness:    Bonnie Harper 45 y.o. female referred by Dr. [name] for surgical evaluation of a right upper lobe biopsy-proven adenocarcinoma.  She does have a history of breast cancer as well as thyroid cancer with papillary adenocarcinoma histology.  The right upper lobe biopsy was performed and additional stains were consistent with pulmonary primary.  She denies any shortness of breath.  She denies any neurologic symptoms.     Smoking Hx: age [age] smoked a few cigarettes a day   Zubrod Score: At the time of surgery this patients most appropriate activity status/level should be described as: [x]     0    Normal activity, no symptoms []     1    Restricted in physical strenuous activity but ambulatory, able to do out light work []     2    Ambulatory and capable of self care, unable to do work activities, up and about               >50 % of waking hours                              []     3    Only limited self care, in bed greater than 50% of waking hours []     4    Completely disabled, no self care, confined to bed or chair []     5    Moribund   Past Medical History:  Diagnosis Date   Anxiety    Breast cancer ([place]) 02/28/2007   Right breast s/p bilat mastectomies   Cancer ([place]) 11/03/2009   Thyroid cancer s/p thyroidectomy    IBS (irritable bowel syndrome)    Thyroid disease     Past Surgical History:  Procedure Laterality Date   BREAST RECONSTRUCTION Bilateral    BRONCHIAL BIOPSY  08/29/2021   Procedure:  BRONCHIAL BIOPSIES;  Surgeon: [name], DO;  Location: [facility];  Service: Pulmonary;;   BRONCHIAL BRUSHINGS  08/29/2021   Procedure: BRONCHIAL BRUSHINGS;  Surgeon: [name], DO;  Location: [facility];  Service: Pulmonary;;   BRONCHIAL NEEDLE ASPIRATION BIOPSY  08/29/2021   Procedure: BRONCHIAL NEEDLE ASPIRATION BIOPSIES;  Surgeon: [name], DO;  Location: [facility];  Service: Pulmonary;;   CESAREAN SECTION     C-section x 2   CHOLECYSTECTOMY     FIDUCIAL MARKER PLACEMENT  08/29/2021   Procedure: FIDUCIAL MARKER PLACEMENT;  Surgeon: [name], DO;  Location: [facility];  Service: Pulmonary;;   IMPLANT OF A SILASTIC METATARSAL PHALANGE JOINT Left 2021   3 titanium rods in joint   MASTECTOMY Bilateral 08/14/2007   PORT-A-CATH REMOVAL  08/14/2007   THYROIDECTOMY  11/03/2009   TONSILLECTOMY     removed as a child    Family History  Problem Relation Age of Onset   Stroke Father 33   Diabetes Father    Asthma Father    Cancer Maternal Uncle 22       melanoma   Cancer Maternal Grandmother 46       bilateral breast cancer   Skin cancer Maternal Grandmother        BCC, SCC, and melanoma    Cancer Other 69       mat great aunt through [facility] with breast cancer   Cancer Other 32       maternal great aunt through [facility] with breast cancer at age 65, ovarian ca at 54 and 2nd breast primary in 87s   Lung cancer Paternal Uncle 70   Cancer Paternal Grandfather        oral; dx late 33s     Social History   Tobacco Use  Smoking Status Former   Types: Cigarettes   Quit date: 04/04/2002   Years since quitting: 19.4  Smokeless Tobacco Never    Social History   Substance and Sexual Activity  Alcohol Use Yes   Alcohol/week: 2.0 standard drinks   Types: 2 Glasses of wine per week     Allergies  Allergen Reactions   Zofran Other (See Comments)    migraines   Sertraline Other (See Comments)    Migraines    Current Outpatient Medications   Medication Sig Dispense Refill   acetaminophen (TYLENOL) 325 MG tablet Take 325 mg by mouth every 4 (four) hours as needed for headache, mild pain or moderate pain.     cetirizine (ZYRTEC) 10 MG tablet Take 10 mg by mouth daily as needed for allergies.     Cholecalciferol (VITAMIN D PO) Take 5,000 Units by mouth daily.     cholestyramine (QUESTRAN) 4 G packet Take 4 g by mouth daily.     Famotidine (PEPCID AC MAXIMUM STRENGTH PO) Take 10 mg by mouth daily.     levothyroxine (SYNTHROID) 112 MCG tablet Take 112 mcg by mouth daily before breakfast.     liothyronine (CYTOMEL) 5 MCG tablet Take 5 mcg by mouth daily.  4   LORazepam (ATIVAN) 1 MG tablet Take 1 mg by mouth daily as needed for anxiety.     Magnesium 250 MG TABS Take 250 mg by mouth daily.     naproxen sodium (ALEVE) 220 MG tablet Take 220 mg by mouth every 12 (twelve) hours as needed (Headache and back pain).     vitamin C (ASCORBIC ACID) 500 MG tablet Take 500 mg by mouth daily.     No current facility-administered medications for this visit.    Review of Systems  Constitutional: Negative.   Respiratory: Negative.    Cardiovascular: Negative.   Neurological: Negative.     PHYSICAL EXAMINATION: BP 140/82    Pulse 68    Ht 5\' 4"  (1.626 m)    Wt 178 lb (80.7 kg)    SpO2 100% Comment: RA   BMI 30.55 kg/m  Physical Exam Constitutional:      General: She is not in acute distress.    Appearance: Normal appearance. She is normal weight. She is not ill-appearing.  HENT:     Head: Normocephalic and atraumatic.  Eyes:     Extraocular Movements: Extraocular movements intact.  Cardiovascular:     Rate and Rhythm: Normal rate.     Heart sounds: No murmur heard. Pulmonary:     Effort: Pulmonary effort is normal. No respiratory distress.  Breath sounds: Normal breath sounds.  Abdominal:     General: Abdomen is flat. There is no distension.  Musculoskeletal:     Cervical back: Normal range of motion.  Skin:    General: Skin  is warm and dry.  Neurological:     General: No focal deficit present.     Mental Status: She is alert and oriented to person, place, and time.    Diagnostic Studies & Laboratory data:     Recent Radiology Findings:   DG CHEST PORT 1 VIEW  Result Date: 08/29/2021 CLINICAL DATA:  Status post bronchoscopy with biopsy. EXAM: PORTABLE CHEST 1 VIEW COMPARISON:  CT of the chest 08/02/2021 chest x-ray 07/17/2007. FINDINGS: Surgical clips overlie the right hilar region and right adnexa. The heart size and mediastinal contours are within normal limits. Both lungs are clear. The visualized skeletal structures are unremarkable. IMPRESSION: No active disease. Electronically Signed   By: Ronney Asters M.D.   On: 08/29/2021 16:01   DG C-ARM BRONCHOSCOPY  Result Date: 08/29/2021 C-ARM BRONCHOSCOPY: Fluoroscopy was utilized by the requesting physician.  No radiographic interpretation.       I have independently reviewed the above radiology studies  and reviewed the findings with the patient.   Recent Lab Findings: Lab Results  Component Value Date   WBC 7.4 08/29/2021   HGB 12.0 08/29/2021   HCT 37.6 08/29/2021   PLT 296 08/29/2021   GLUCOSE 94 09/06/2016   ALT 19 09/06/2016   AST 17 09/06/2016   NA 141 09/06/2016   K 4.2 09/06/2016   CL 103 06/16/2010   CREATININE 0.8 09/06/2016   BUN 8.1 09/06/2016   CO2 25 09/06/2016   TSH 2.245 07/28/2007   INR 1.00 (L) 04/23/2007     PFTs:  - FVC: 95% - FEV1: 90% -DLCO: 118%  Problem List: Right upper lobe 1 cm adenocarcinoma. Additional subcentimeter pulmonary nodules in an the right upper lobe. History of bilateral mastectomies with breast implants.       Assessment / Plan:   45 yo female with history of breast and thyroid cancer who also has multiple bilateral pulmonary nodules.  The largest of which is 1 cm in the right upper lobe.  I personally reviewed her cross-sectional imaging most of the pulmonary nodules are subcentimeter  and on her PET/CT from August of this year she did have mild activity in the 1 cm nodule.    Pathology results are consistent with primary lung cancer.  We discussed the risks and benefits of surgical resection which will include a right robotic assisted thoracoscopy with left upper lobectomy.  She is agreeable to proceed.  She is tentatively scheduled for late January.  I  spent 40 minutes with  the patient face to face in counseling and coordination of care.    [name] 09/15/2021 3:59 PM

## 2021-09-15 ENCOUNTER — Institutional Professional Consult (permissible substitution) (INDEPENDENT_AMBULATORY_CARE_PROVIDER_SITE_OTHER): Payer: No Typology Code available for payment source | Admitting: Thoracic Surgery (Cardiothoracic Vascular Surgery)

## 2021-09-15 ENCOUNTER — Encounter: Payer: Self-pay | Admitting: *Deleted

## 2021-09-15 ENCOUNTER — Other Ambulatory Visit: Payer: Self-pay

## 2021-09-15 ENCOUNTER — Other Ambulatory Visit: Payer: Self-pay | Admitting: *Deleted

## 2021-09-15 VITALS — BP 140/82 | HR 68 | Ht 64.0 in | Wt 178.0 lb

## 2021-09-15 DIAGNOSIS — Z853 Personal history of malignant neoplasm of breast: Secondary | ICD-10-CM

## 2021-09-15 DIAGNOSIS — C3490 Malignant neoplasm of unspecified part of unspecified bronchus or lung: Secondary | ICD-10-CM | POA: Insufficient documentation

## 2021-09-15 DIAGNOSIS — R911 Solitary pulmonary nodule: Secondary | ICD-10-CM

## 2021-10-06 ENCOUNTER — Telehealth: Payer: Self-pay | Admitting: Pulmonary Disease

## 2021-10-06 NOTE — Telephone Encounter (Signed)
Rec'd fax from [facility] on 09/26/2021 requesting additional information on patient's health condition.  Sent to Dr. [name] for completion and he sent it back to front office to be completed.  Emailed to [employer] on 1/6.

## 2021-10-19 ENCOUNTER — Telehealth: Payer: Self-pay

## 2021-10-19 ENCOUNTER — Other Ambulatory Visit: Payer: Self-pay

## 2021-10-19 ENCOUNTER — Emergency Department (HOSPITAL_BASED_OUTPATIENT_CLINIC_OR_DEPARTMENT_OTHER): Payer: No Typology Code available for payment source

## 2021-10-19 ENCOUNTER — Emergency Department (HOSPITAL_BASED_OUTPATIENT_CLINIC_OR_DEPARTMENT_OTHER)
Admission: EM | Admit: 2021-10-19 | Discharge: 2021-10-19 | Disposition: A | Discharge status: Home / Self Care | Payer: No Typology Code available for payment source | Attending: Emergency Medicine | Admitting: Emergency Medicine

## 2021-10-19 ENCOUNTER — Encounter (HOSPITAL_BASED_OUTPATIENT_CLINIC_OR_DEPARTMENT_OTHER): Payer: Self-pay | Admitting: Radiology

## 2021-10-19 DIAGNOSIS — S022XXA Fracture of nasal bones, initial encounter for closed fracture: Secondary | ICD-10-CM | POA: Diagnosis not present

## 2021-10-19 DIAGNOSIS — S01511A Laceration without foreign body of lip, initial encounter: Secondary | ICD-10-CM | POA: Insufficient documentation

## 2021-10-19 DIAGNOSIS — W19XXXA Unspecified fall, initial encounter: Secondary | ICD-10-CM

## 2021-10-19 DIAGNOSIS — S0993XA Unspecified injury of face, initial encounter: Secondary | ICD-10-CM | POA: Diagnosis present

## 2021-10-19 DIAGNOSIS — W010XXA Fall on same level from slipping, tripping and stumbling without subsequent striking against object, initial encounter: Secondary | ICD-10-CM | POA: Insufficient documentation

## 2021-10-19 NOTE — ED Provider Notes (Signed)
[facility] EMERGENCY DEPT Provider Note   CSN: [id number] Arrival date & time: 10/19/21  1217     History  Chief Complaint  Patient presents with   Fall    Bonnie Harper is a 46 y.o. female.  She is here for evaluation of injuries after a fall.  She said she was going down some stairs when she tripped, she struck her face.  No loss of consciousness.  Complaining of head pain and nose pain and upper lip laceration.  No malocclusion or loose teeth.  No blurry vision double vision.  No neck or back pain.  Not on any blood thinners.  Occurred about an hour prior to arrival.  The history is provided by the patient.  Fall This is a new problem. The current episode started 1 to 2 hours ago. The problem occurs constantly. The problem has not changed since onset.Associated symptoms include headaches. Pertinent negatives include no chest pain, no abdominal pain and no shortness of breath. Nothing aggravates the symptoms. Nothing relieves the symptoms. Treatments tried: Ibuprofen. The treatment provided no relief.      Home Medications Prior to Admission medications   Medication Sig Start Date End Date Taking? Authorizing Provider  acetaminophen (TYLENOL) 325 MG tablet Take 325 mg by mouth every 4 (four) hours as needed for headache, mild pain or moderate pain.    [provider]  cetirizine (ZYRTEC) 10 MG tablet Take 10 mg by mouth daily as needed for allergies.    [provider]  Cholecalciferol (VITAMIN D PO) Take 5,000 Units by mouth daily.    [provider]  cholestyramine Lucrezia Starch) 4 G packet Take 4 g by mouth daily. 03/01/14   [provider]  Famotidine (PEPCID AC MAXIMUM STRENGTH PO) Take 10 mg by mouth daily.    [provider]  levothyroxine (SYNTHROID) 112 MCG tablet Take 112 mcg by mouth daily before breakfast.    [provider]  liothyronine (CYTOMEL) 5 MCG tablet Take 5 mcg by mouth daily. 08/19/16    [provider]  LORazepam (ATIVAN) 1 MG tablet Take 1 mg by mouth daily as needed for anxiety.    [provider]  Magnesium 250 MG TABS Take 250 mg by mouth daily.    [provider]  naproxen sodium (ALEVE) 220 MG tablet Take 220 mg by mouth every 12 (twelve) hours as needed (Headache and back pain).    [provider]  vitamin C (ASCORBIC ACID) 500 MG tablet Take 500 mg by mouth daily.    [provider]      Allergies    Zofran and Sertraline    Review of Systems   Review of Systems  Constitutional:  Negative for fever.  HENT:  Negative for nosebleeds, sore throat and trouble swallowing.   Eyes:  Negative for visual disturbance.  Respiratory:  Negative for shortness of breath.   Cardiovascular:  Negative for chest pain.  Gastrointestinal:  Negative for abdominal pain.  Genitourinary:  Negative for dysuria.  Musculoskeletal:  Negative for neck pain.  Skin:  Positive for wound. Negative for rash.  Neurological:  Positive for headaches.   Physical Exam Updated Vital Signs BP (!) 162/101 (BP Location: Left Arm)    Pulse 66    Temp 97.7 F (36.5 C) (Oral)    Resp 20    Ht 5\' 4"  (1.626 m)    Wt 80.7 kg    SpO2 100%    BMI 30.55 kg/m  Physical  Exam Vitals and nursing note reviewed.  Constitutional:      General: She is not in acute distress.    Appearance: She is well-developed.  HENT:     Head: Normocephalic.     Nose:     Comments: Nose is swollen and tender.  No septal hematoma    Mouth/Throat:     Comments: Upper lip laceration Eyes:     Conjunctiva/sclera: Conjunctivae normal.  Cardiovascular:     Rate and Rhythm: Normal rate and regular rhythm.     Heart sounds: No murmur heard. Pulmonary:     Effort: Pulmonary effort is normal. No respiratory distress.     Breath sounds: Normal breath sounds.  Abdominal:     Palpations: Abdomen is soft.     Tenderness: There is no abdominal tenderness.  Musculoskeletal:         General: No swelling. Normal range of motion.     Cervical back: Neck supple.  Skin:    General: Skin is warm and dry.     Capillary Refill: Capillary refill takes less than 2 seconds.  Neurological:     General: No focal deficit present.     Mental Status: She is alert.    ED Results / Procedures / Treatments   Labs (all labs ordered are listed, but only abnormal results are displayed) Labs Reviewed - No data to display  EKG None  Radiology CT Head Wo Contrast  Result Date: 10/19/2021 CLINICAL DATA:  Trip and fall down stairs, swelling to nose and laceration to upper lip EXAM: CT HEAD WITHOUT CONTRAST CT MAXILLOFACIAL WITHOUT CONTRAST TECHNIQUE: Multidetector CT imaging of the head and maxillofacial structures were performed using the standard protocol without intravenous contrast. Multiplanar CT image reconstructions of the maxillofacial structures were also generated. RADIATION DOSE REDUCTION: This exam was performed according to the departmental dose-optimization program which includes automated exposure control, adjustment of the mA and/or kV according to patient size and/or use of iterative reconstruction technique. COMPARISON:  None. FINDINGS: CT HEAD FINDINGS Brain: No evidence of acute infarction, hemorrhage, hydrocephalus, extra-axial collection or mass lesion/mass effect. Vascular: No hyperdense vessel or unexpected calcification. Skull: Normal. Negative for fracture or focal lesion. Other: None. CT MAXILLOFACIAL FINDINGS Osseous: Minimally angulated and displaced fractures of the nasal bones (series 4, image 31). No other displaced fracture or mandibular dislocation. No destructive process. Orbits: Negative. No traumatic or inflammatory finding. Sinuses: Clear. Soft tissues: Negative. IMPRESSION: 1. No acute intracranial pathology. 2. Minimally angulated and displaced fractures of the nasal bones. Electronically Signed   By: Delanna Ahmadi M.D.   On: 10/19/2021 13:23   CT  Maxillofacial WO CM  Result Date: 10/19/2021 CLINICAL DATA:  Trip and fall down stairs, swelling to nose and laceration to upper lip EXAM: CT HEAD WITHOUT CONTRAST CT MAXILLOFACIAL WITHOUT CONTRAST TECHNIQUE: Multidetector CT imaging of the head and maxillofacial structures were performed using the standard protocol without intravenous contrast. Multiplanar CT image reconstructions of the maxillofacial structures were also generated. RADIATION DOSE REDUCTION: This exam was performed according to the departmental dose-optimization program which includes automated exposure control, adjustment of the mA and/or kV according to patient size and/or use of iterative reconstruction technique. COMPARISON:  None. FINDINGS: CT HEAD FINDINGS Brain: No evidence of acute infarction, hemorrhage, hydrocephalus, extra-axial collection or mass lesion/mass effect. Vascular: No hyperdense vessel or unexpected calcification. Skull: Normal. Negative for fracture or focal lesion. Other: None. CT MAXILLOFACIAL FINDINGS Osseous: Minimally angulated and displaced fractures of the nasal bones (series 4,  image 31). No other displaced fracture or mandibular dislocation. No destructive process. Orbits: Negative. No traumatic or inflammatory finding. Sinuses: Clear. Soft tissues: Negative. IMPRESSION: 1. No acute intracranial pathology. 2. Minimally angulated and displaced fractures of the nasal bones. Electronically Signed   By: Delanna Ahmadi M.D.   On: 10/19/2021 13:23    Procedures .Marland KitchenLaceration Repair  Date/Time: 10/19/2021 8:46 PM Performed by: Hayden Rasmussen, MD Authorized by: Hayden Rasmussen, MD   Consent:    Consent obtained:  Verbal   Consent given by:  Patient   Risks discussed:  Infection, pain, poor cosmetic result, poor wound healing and retained foreign body   Alternatives discussed:  No treatment and delayed treatment Universal protocol:    Procedure explained and questions answered to patient or proxy's  satisfaction: yes     Patient identity confirmed:  Verbally with patient Anesthesia:    Anesthesia method:  None Laceration details:    Location:  Lip   Lip location:  Upper exterior lip Treatment:    Area cleansed with:  Saline Skin repair:    Repair method:  Tissue adhesive Approximation:    Approximation:  Close   Vermilion border well-aligned: yes   Repair type:    Repair type:  Simple Post-procedure details:    Dressing:  Open (no dressing)    Medications Ordered in ED Medications - No data to display  ED Course/ Medical Decision Making/ A&P                           Medical Decision Making Amount and/or Complexity of Data Reviewed Radiology: ordered.   This patient complains of facial pain after fall; this involves an extensive number of treatment Options and is a complaint that carries with it a high risk of complications and Morbidity. The differential includes facial fracture, laceration, contusion, intracranial bleed I ordered imaging studies which included CT head and CT max face and I independently    visualized and interpreted imaging which showed nasal fracture Additional history obtained from patient's husband Previous records obtained and reviewed in epic prior pulmonology notes and awaiting endobronchial procedure  After the interventions stated above, I reevaluated the patient and found patient hemodynamically stable otherwise asymptomatic.  Recommended ice and symptomatic treatment.  Wound Dermabond to close.'        Final Clinical Impression(s) / ED Diagnoses Final diagnoses:  Fall, initial encounter  Closed fracture of nasal bone, initial encounter  Lip laceration, initial encounter    Rx / DC Orders ED Discharge Orders     None         Hayden Rasmussen, MD 10/19/21 2047

## 2021-10-19 NOTE — Discharge Instructions (Addendum)
You were seen in the emergency department for evaluation of injuries from a fall.  You had a CAT scan of your head and face.  You have a fracture of your nasal bone.  You also had a lip laceration that was treated with skin glue.  You can use Tylenol and ibuprofen for pain.  Ice.  Follow-up with ENT regarding the nasal fracture.

## 2021-10-19 NOTE — Telephone Encounter (Signed)
FMLA form completed and faxed to [phone]/[facility] per pt's request. Beginning LOA 10/30/21 through approx 12/28/21. Pt can RTW on 11/13/21 with restrictions of working from home until 12/28/20. Forms mail to home address.

## 2021-10-19 NOTE — ED Triage Notes (Signed)
Pt reports a mechanical trip down 10 stairs. Denies any lightheaded or dizziness before or after the fall. Denies LOC or vomiting. Pt presents with swelling to nose and lac to the upper lip. A/Ox4 on arrival with steady gait to room.

## 2021-10-25 NOTE — Pre-Procedure Instructions (Signed)
Surgical Instructions    Your procedure is scheduled on Monday 10/30/21.   Report to [facility] Main Entrance "A" at 05:30 A.M., then check in with the Admitting office.  Call this number if you have problems the morning of surgery:  [phone]   If you have any questions prior to your surgery date call [phone]: Open Monday-Friday 8am-4pm    Remember:  Do not eat or drink after midnight the night before your surgery    Take these medicines the morning of surgery with A SIP OF WATER   levothyroxine (SYNTHROID)  liothyronine (CYTOMEL)    Take these medicines if needed:   acetaminophen (TYLENOL)   cetirizine (ZYRTEC)  LORazepam (ATIVAN)   As of today, STOP taking any Aspirin (unless otherwise instructed by your surgeon) Aleve, Naproxen, Ibuprofen, Motrin, Advil, Goody's, BC's, all herbal medications, fish oil, and all vitamins.  After your COVID test   You are not required to quarantine however you are required to wear a well-fitting mask when you are out and around people not in your household.  If your mask becomes wet or soiled, replace with a new one.  Wash your hands often with soap and water for 20 seconds or clean your hands with an alcohol-based hand sanitizer that contains at least 60% alcohol.  Do not share personal items.  Notify your provider: if you are in close contact with someone who has COVID  or if you develop a fever of 100.4 or greater, sneezing, cough, sore throat, shortness of breath or body aches.           Do not wear jewelry or makeup Do not wear lotions, powders, perfumes/colognes, or deodorant. Do not shave 48 hours prior to surgery.  Men may shave face and neck. Do not bring valuables to the hospital. DO Not wear nail polish, gel polish, artificial nails, or any other type of covering on natural nails (fingers and toes) If you have artificial nails or gel coating that need to be removed by a nail salon, please have this removed prior to  surgery. Artificial nails or gel coating may interfere with anesthesia's ability to adequately monitor your vital signs.             [facility] is not responsible for any belongings or valuables.  Do NOT Smoke (Tobacco/Vaping)  24 hours prior to your procedure  If you use a CPAP at night, you may bring your mask for your overnight stay.   Contacts, glasses, hearing aids, dentures or partials may not be worn into surgery, please bring cases for these belongings   For patients admitted to the hospital, discharge time will be determined by your treatment team.   Patients discharged the day of surgery will not be allowed to drive home, and someone needs to stay with them for 24 hours.  NO VISITORS WILL BE ALLOWED IN PRE-OP WHERE PATIENTS ARE PREPPED FOR SURGERY.  ONLY 1 SUPPORT PERSON MAY BE PRESENT IN THE WAITING ROOM WHILE YOU ARE IN SURGERY.  IF YOU ARE TO BE ADMITTED, ONCE YOU ARE IN YOUR ROOM YOU WILL BE ALLOWED TWO (2) VISITORS. 1 (ONE) VISITOR MAY STAY OVERNIGHT BUT MUST ARRIVE TO THE ROOM BY 8pm.  Minor children may have two parents present. Special consideration for safety and communication needs will be reviewed on a case by case basis.  Special instructions:    Oral Hygiene is also important to reduce your risk of infection.  Remember - BRUSH YOUR TEETH THE  MORNING OF SURGERY WITH YOUR REGULAR TOOTHPASTE   [facility]- Preparing For Surgery  Before surgery, you can play an important role. Because skin is not sterile, your skin needs to be as free of germs as possible. You can reduce the number of germs on your skin by washing with CHG (chlorahexidine gluconate) Soap before surgery.  CHG is an antiseptic cleaner which kills germs and bonds with the skin to continue killing germs even after washing.     Please do not use if you have an allergy to CHG or antibacterial soaps. If your skin becomes reddened/irritated stop using the CHG.  Do not shave (including legs and underarms) for at  least 48 hours prior to first CHG shower. It is OK to shave your face.  Please follow these instructions carefully.     Shower the NIGHT BEFORE SURGERY and the MORNING OF SURGERY with CHG Soap.   If you chose to wash your hair, wash your hair first as usual with your normal shampoo. After you shampoo, rinse your hair and body thoroughly to remove the shampoo.  Then [employer] and genitals (private parts) with your normal soap and rinse thoroughly to remove soap.  After that Use CHG Soap as you would any other liquid soap. You can apply CHG directly to the skin and wash gently with a scrungie or a clean washcloth.   Apply the CHG Soap to your body ONLY FROM THE NECK DOWN.  Do not use on open wounds or open sores. Avoid contact with your eyes, ears, mouth and genitals (private parts). Wash Face and genitals (private parts)  with your normal soap.   Wash thoroughly, paying special attention to the area where your surgery will be performed.  Thoroughly rinse your body with warm water from the neck down.  DO NOT shower/wash with your normal soap after using and rinsing off the CHG Soap.  Pat yourself dry with a CLEAN TOWEL.  Wear CLEAN PAJAMAS to bed the night before surgery  Place CLEAN SHEETS on your bed the night before your surgery  DO NOT SLEEP WITH PETS.   Day of Surgery:  Take a shower with CHG soap. Wear Clean/Comfortable clothing the morning of surgery Do not apply any deodorants/lotions.   Remember to brush your teeth WITH YOUR REGULAR TOOTHPASTE.   Please read over the following fact sheets that you were given.

## 2021-10-26 ENCOUNTER — Encounter (HOSPITAL_COMMUNITY): Payer: Self-pay

## 2021-10-26 ENCOUNTER — Encounter (HOSPITAL_COMMUNITY)
Admission: RE | Admit: 2021-10-26 | Discharge: 2021-10-26 | Disposition: A | Discharge status: Home / Self Care | Payer: No Typology Code available for payment source | Source: Ambulatory Visit | Attending: Thoracic Surgery (Cardiothoracic Vascular Surgery) | Admitting: Thoracic Surgery (Cardiothoracic Vascular Surgery)

## 2021-10-26 ENCOUNTER — Other Ambulatory Visit: Payer: Self-pay

## 2021-10-26 ENCOUNTER — Encounter (HOSPITAL_COMMUNITY): Admission: RE | Admit: 2021-10-26 | Payer: No Typology Code available for payment source | Source: Ambulatory Visit

## 2021-10-26 DIAGNOSIS — R911 Solitary pulmonary nodule: Secondary | ICD-10-CM | POA: Insufficient documentation

## 2021-10-26 DIAGNOSIS — Z01818 Encounter for other preprocedural examination: Secondary | ICD-10-CM | POA: Diagnosis not present

## 2021-10-26 DIAGNOSIS — Z20822 Contact with and (suspected) exposure to covid-19: Secondary | ICD-10-CM | POA: Diagnosis not present

## 2021-10-26 LAB — COMPREHENSIVE METABOLIC PANEL
ALT: 17 U/L (ref 0–44)
AST: 23 U/L (ref 15–41)
Albumin: 3.9 g/dL (ref 3.5–5.0)
Alkaline Phosphatase: 63 U/L (ref 38–126)
Anion gap: 8 (ref 5–15)
BUN: 16 mg/dL (ref 6–20)
CO2: 21 mmol/L — ABNORMAL LOW (ref 22–32)
Calcium: 9 mg/dL (ref 8.9–10.3)
Chloride: 105 mmol/L (ref 98–111)
Creatinine, Ser: 0.61 mg/dL (ref 0.44–1.00)
GFR, Estimated: >60 mL/min (ref >60)
Glucose, Bld: 99 mg/dL (ref 70–99)
Potassium: 4 mmol/L (ref 3.5–5.1)
Sodium: 134 mmol/L — ABNORMAL LOW (ref 135–145)
Total Bilirubin: 0.5 mg/dL (ref 0.3–1.2)
Total Protein: 6.9 g/dL (ref 6.5–8.1)

## 2021-10-26 LAB — BLOOD GAS, ARTERIAL
Acid-base deficit: 1.9 mmol/L (ref 0.0–2.0)
Bicarbonate: 22.1 mmol/L (ref 20.0–28.0)
Drawn by: 60286
FIO2: 21
O2 Saturation: 98.2 %
Patient temperature: 37
pCO2 arterial: 35.7 mmHg (ref 32.0–48.0)
pH, Arterial: 7.408 (ref 7.350–7.450)
pO2, Arterial: 108 mmHg (ref 83.0–108.0)

## 2021-10-26 LAB — CBC
HCT: 36.2 % (ref 36.0–46.0)
Hemoglobin: 11.4 g/dL — ABNORMAL LOW (ref 12.0–15.0)
MCH: 27.1 pg (ref 26.0–34.0)
MCHC: 31.5 g/dL (ref 30.0–36.0)
MCV: 86.2 fL (ref 80.0–100.0)
Platelets: 286 10*3/uL (ref 150–400)
RBC: 4.2 MIL/uL (ref 3.87–5.11)
RDW: 14.4 % (ref 11.5–15.5)
WBC: 6.7 10*3/uL (ref 4.0–10.5)
nRBC: 0 % (ref 0.0–0.2)

## 2021-10-26 LAB — SURGICAL PCR SCREEN
MRSA, PCR: NEGATIVE
Staphylococcus aureus: POSITIVE — AB

## 2021-10-26 LAB — PROTIME-INR
INR: 1 (ref 0.8–1.2)
Prothrombin Time: 12.8 seconds (ref 11.4–15.2)

## 2021-10-26 LAB — URINALYSIS, ROUTINE W REFLEX MICROSCOPIC
Bilirubin Urine: NEGATIVE
Glucose, UA: NEGATIVE mg/dL
Hgb urine dipstick: NEGATIVE
Ketones, ur: NEGATIVE mg/dL
Leukocytes,Ua: NEGATIVE
Nitrite: NEGATIVE
Protein, ur: NEGATIVE mg/dL
Specific Gravity, Urine: 1.012 (ref 1.005–1.030)
pH: 6 (ref 5.0–8.0)

## 2021-10-26 LAB — SARS CORONAVIRUS 2 (TAT 6-24 HRS): SARS Coronavirus 2: NEGATIVE

## 2021-10-26 LAB — APTT: aPTT: 24 seconds (ref 24–36)

## 2021-10-26 NOTE — Progress Notes (Signed)
PCP - Dr. Lysle Rubens Cardiologist -  patient denies  PPM/ICD - n/a Device Orders -  Rep Notified -   Chest x-ray - 10/26/21 EKG - 10/26/21 Stress Test - patient denies ECHO - patient denies Cardiac Cath - patient denies  Sleep Study - n/a CPAP -   Fasting Blood Sugar - n/a Checks Blood Sugar _____ times a day  Blood Thinner Instructions: n/a Aspirin Instructions: n/a  ERAS Protcol - PRE-SURGERY Ensure or G2-   COVID TEST-  at PAT appointment   Anesthesia review: yes  Patient denies shortness of breath, fever, cough and chest pain at PAT appointment   All instructions explained to the patient, with a verbal understanding of the material. Patient agrees to go over the instructions while at home for a better understanding. Patient also instructed to self quarantine after being tested for COVID-19. The opportunity to ask questions was provided.

## 2021-10-27 LAB — TYPE AND SCREEN
ABO/RH(D): O NEG
Antibody Screen: NEGATIVE

## 2021-10-29 NOTE — Anesthesia Preprocedure Evaluation (Addendum)
Anesthesia Evaluation  Patient identified by MRN, date of birth, ID band Patient awake    Reviewed: Allergy & Precautions, NPO status , Patient's Chart, lab work & pertinent test results  Airway Mallampati: III  TM Distance: >3 FB Neck ROM: Full    Dental no notable dental hx.    Pulmonary former smoker,    Pulmonary exam normal breath sounds clear to auscultation       Cardiovascular negative cardio ROS Normal cardiovascular exam Rhythm:Regular Rate:Normal     Neuro/Psych Anxiety negative neurological ROS     GI/Hepatic negative GI ROS, Neg liver ROS,   Endo/Other  Hypothyroidism   Renal/GU negative Renal ROS     Musculoskeletal negative musculoskeletal ROS (+)   Abdominal (+) + obese,   Peds  Hematology negative hematology ROS (+)   Anesthesia Other Findings LUNG CANCER  Reproductive/Obstetrics                            Anesthesia Physical Anesthesia Plan  ASA: 2  Anesthesia Plan: General   Post-op Pain Management:    Induction: Intravenous  PONV Risk Score and Plan: 3 and Ondansetron, Dexamethasone, Midazolam and Treatment may vary due to age or medical condition  Airway Management Planned: Double Lumen EBT  Additional Equipment:   Intra-op Plan:   Post-operative Plan: Extubation in OR  Informed Consent: I have reviewed the patients History and Physical, chart, labs and discussed the procedure including the risks, benefits and alternatives for the proposed anesthesia with the patient or authorized representative who has indicated his/her understanding and acceptance.     Dental advisory given  Plan Discussed with: CRNA  Anesthesia Plan Comments: ([place]arsight)       Anesthesia Quick Evaluation

## 2021-10-30 ENCOUNTER — Inpatient Hospital Stay (HOSPITAL_COMMUNITY): Payer: No Typology Code available for payment source

## 2021-10-30 ENCOUNTER — Encounter (HOSPITAL_COMMUNITY): Payer: Self-pay | Admitting: Thoracic Surgery (Cardiothoracic Vascular Surgery)

## 2021-10-30 ENCOUNTER — Other Ambulatory Visit: Payer: Self-pay

## 2021-10-30 ENCOUNTER — Inpatient Hospital Stay (HOSPITAL_COMMUNITY): Payer: No Typology Code available for payment source | Admitting: Anesthesiology

## 2021-10-30 ENCOUNTER — Encounter (HOSPITAL_COMMUNITY)
Admission: RE | Disposition: A | Discharge status: Home / Self Care | Payer: Self-pay | Source: Home / Self Care | Attending: Thoracic Surgery (Cardiothoracic Vascular Surgery)

## 2021-10-30 ENCOUNTER — Inpatient Hospital Stay (HOSPITAL_COMMUNITY)
Admission: RE | Admit: 2021-10-30 | Discharge: 2021-11-02 | DRG: 164 | Disposition: A | Discharge status: Home / Self Care | Payer: No Typology Code available for payment source | Attending: Thoracic Surgery (Cardiothoracic Vascular Surgery) | Admitting: Thoracic Surgery (Cardiothoracic Vascular Surgery)

## 2021-10-30 DIAGNOSIS — E669 Obesity, unspecified: Secondary | ICD-10-CM | POA: Diagnosis present

## 2021-10-30 DIAGNOSIS — E890 Postprocedural hypothyroidism: Secondary | ICD-10-CM | POA: Diagnosis present

## 2021-10-30 DIAGNOSIS — Z8041 Family history of malignant neoplasm of ovary: Secondary | ICD-10-CM | POA: Diagnosis not present

## 2021-10-30 DIAGNOSIS — J939 Pneumothorax, unspecified: Secondary | ICD-10-CM

## 2021-10-30 DIAGNOSIS — Z801 Family history of malignant neoplasm of trachea, bronchus and lung: Secondary | ICD-10-CM | POA: Diagnosis not present

## 2021-10-30 DIAGNOSIS — C3411 Malignant neoplasm of upper lobe, right bronchus or lung: Secondary | ICD-10-CM | POA: Diagnosis present

## 2021-10-30 DIAGNOSIS — Z85850 Personal history of malignant neoplasm of thyroid: Secondary | ICD-10-CM

## 2021-10-30 DIAGNOSIS — Z902 Acquired absence of lung [part of]: Secondary | ICD-10-CM

## 2021-10-30 DIAGNOSIS — I310 Chronic adhesive pericarditis: Secondary | ICD-10-CM | POA: Diagnosis present

## 2021-10-30 DIAGNOSIS — Z853 Personal history of malignant neoplasm of breast: Secondary | ICD-10-CM

## 2021-10-30 DIAGNOSIS — Z20822 Contact with and (suspected) exposure to covid-19: Secondary | ICD-10-CM | POA: Diagnosis present

## 2021-10-30 DIAGNOSIS — I10 Essential (primary) hypertension: Secondary | ICD-10-CM | POA: Diagnosis present

## 2021-10-30 DIAGNOSIS — Z803 Family history of malignant neoplasm of breast: Secondary | ICD-10-CM

## 2021-10-30 DIAGNOSIS — Z823 Family history of stroke: Secondary | ICD-10-CM | POA: Diagnosis not present

## 2021-10-30 DIAGNOSIS — Z808 Family history of malignant neoplasm of other organs or systems: Secondary | ICD-10-CM | POA: Diagnosis not present

## 2021-10-30 DIAGNOSIS — Z87891 Personal history of nicotine dependence: Secondary | ICD-10-CM | POA: Diagnosis not present

## 2021-10-30 DIAGNOSIS — Z9013 Acquired absence of bilateral breasts and nipples: Secondary | ICD-10-CM | POA: Diagnosis not present

## 2021-10-30 DIAGNOSIS — J9383 Other pneumothorax: Secondary | ICD-10-CM | POA: Diagnosis not present

## 2021-10-30 DIAGNOSIS — Z833 Family history of diabetes mellitus: Secondary | ICD-10-CM

## 2021-10-30 DIAGNOSIS — Z6830 Body mass index (BMI) 30.0-30.9, adult: Secondary | ICD-10-CM

## 2021-10-30 DIAGNOSIS — R911 Solitary pulmonary nodule: Secondary | ICD-10-CM

## 2021-10-30 HISTORY — PX: INTERCOSTAL NERVE BLOCK: SHX5021

## 2021-10-30 HISTORY — PX: NODE DISSECTION: SHX5269

## 2021-10-30 LAB — ABO/RH: ABO/RH(D): O NEG

## 2021-10-30 LAB — POCT PREGNANCY, URINE: Preg Test, Ur: NEGATIVE

## 2021-10-30 SURGERY — LOBECTOMY, LUNG, ROBOT-ASSISTED, USING VATS
Anesthesia: General | Site: Chest | Laterality: Right

## 2021-10-30 MED ORDER — ORAL CARE MOUTH RINSE: 15.0000 mL | Freq: Once | OROMUCOSAL | Status: AC

## 2021-10-30 MED ORDER — KETOROLAC TROMETHAMINE 15 MG/ML IJ SOLN
30.0000 mg | Freq: Four times a day (QID) | INTRAMUSCULAR | Status: AC
  Administered 2021-10-30 – 2021-10-31 (×6): 30 mg via INTRAVENOUS
  Filled 2021-10-30 (×7): qty 2

## 2021-10-30 MED ORDER — METOCLOPRAMIDE HCL 5 MG/ML IJ SOLN
10.0000 mg | Freq: Four times a day (QID) | INTRAMUSCULAR | Status: DC | PRN
  Administered 2021-10-30: 10 mg via INTRAVENOUS
  Filled 2021-10-30: qty 2

## 2021-10-30 MED ORDER — LORAZEPAM 1 MG PO TABS: 1.0000 mg | ORAL_TABLET | Freq: Every day | ORAL | Status: DC | PRN

## 2021-10-30 MED ORDER — PROMETHAZINE HCL 25 MG/ML IJ SOLN: 6.2500 mg | INTRAMUSCULAR | Status: DC | PRN

## 2021-10-30 MED ORDER — SUGAMMADEX SODIUM 200 MG/2ML IV SOLN
INTRAVENOUS | Status: DC | PRN
  Administered 2021-10-30: 300 mg via INTRAVENOUS

## 2021-10-30 MED ORDER — PHENYLEPHRINE 40 MCG/ML (10ML) SYRINGE FOR IV PUSH (FOR BLOOD PRESSURE SUPPORT)
PREFILLED_SYRINGE | INTRAVENOUS | Status: AC
  Filled 2021-10-30: qty 10

## 2021-10-30 MED ORDER — OXYCODONE HCL 5 MG PO TABS: 5.0000 mg | ORAL_TABLET | Freq: Once | ORAL | Status: DC | PRN

## 2021-10-30 MED ORDER — LEVOTHYROXINE SODIUM 112 MCG PO TABS
112.0000 ug | ORAL_TABLET | Freq: Every day | ORAL | Status: DC
  Administered 2021-10-31 – 2021-11-02 (×3): 112 ug via ORAL
  Filled 2021-10-30 (×3): qty 1

## 2021-10-30 MED ORDER — AMISULPRIDE (ANTIEMETIC) 5 MG/2ML IV SOLN: 10.0000 mg | Freq: Once | INTRAVENOUS | Status: DC | PRN

## 2021-10-30 MED ORDER — ACETAMINOPHEN 500 MG PO TABS
1000.0000 mg | ORAL_TABLET | Freq: Four times a day (QID) | ORAL | Status: DC
  Administered 2021-10-30 – 2021-11-02 (×11): 1000 mg via ORAL
  Filled 2021-10-30 (×11): qty 2

## 2021-10-30 MED ORDER — LIOTHYRONINE SODIUM 5 MCG PO TABS
5.0000 ug | ORAL_TABLET | Freq: Every day | ORAL | Status: DC
  Administered 2021-10-31 – 2021-11-02 (×3): 5 ug via ORAL
  Filled 2021-10-30 (×3): qty 1

## 2021-10-30 MED ORDER — OXYCODONE HCL 5 MG/5ML PO SOLN: 5.0000 mg | Freq: Once | ORAL | Status: DC | PRN

## 2021-10-30 MED ORDER — FENTANYL CITRATE (PF) 250 MCG/5ML IJ SOLN
INTRAMUSCULAR | Status: AC
  Filled 2021-10-30: qty 5

## 2021-10-30 MED ORDER — ROCURONIUM BROMIDE 10 MG/ML (PF) SYRINGE
PREFILLED_SYRINGE | INTRAVENOUS | Status: DC | PRN
  Administered 2021-10-30 (×2): 50 mg via INTRAVENOUS
  Administered 2021-10-30 (×2): 30 mg via INTRAVENOUS
  Administered 2021-10-30: 40 mg via INTRAVENOUS

## 2021-10-30 MED ORDER — TRAMADOL HCL 50 MG PO TABS
50.0000 mg | ORAL_TABLET | Freq: Four times a day (QID) | ORAL | Status: DC | PRN
  Administered 2021-10-30: 50 mg via ORAL
  Administered 2021-11-01: 100 mg via ORAL
  Filled 2021-10-30: qty 1
  Filled 2021-10-30: qty 2

## 2021-10-30 MED ORDER — PROPOFOL 10 MG/ML IV BOLUS
INTRAVENOUS | Status: DC | PRN
  Administered 2021-10-30: 200 mg via INTRAVENOUS

## 2021-10-30 MED ORDER — LACTATED RINGERS IV SOLN: INTRAVENOUS | Status: DC

## 2021-10-30 MED ORDER — LORATADINE 10 MG PO TABS
10.0000 mg | ORAL_TABLET | Freq: Every day | ORAL | Status: DC
  Administered 2021-10-30 – 2021-11-02 (×3): 10 mg via ORAL
  Filled 2021-10-30 (×4): qty 1

## 2021-10-30 MED ORDER — HEMOSTATIC AGENTS (NO CHARGE) OPTIME
TOPICAL | Status: DC | PRN
  Administered 2021-10-30: 1 via TOPICAL

## 2021-10-30 MED ORDER — GLYCOPYRROLATE 0.2 MG/ML IJ SOLN
INTRAMUSCULAR | Status: DC | PRN
  Administered 2021-10-30 (×2): .1 mg via INTRAVENOUS

## 2021-10-30 MED ORDER — DEXAMETHASONE SODIUM PHOSPHATE 10 MG/ML IJ SOLN
INTRAMUSCULAR | Status: DC | PRN
  Administered 2021-10-30: 10 mg via INTRAVENOUS

## 2021-10-30 MED ORDER — CEFAZOLIN SODIUM-DEXTROSE 2-4 GM/100ML-% IV SOLN
2.0000 g | Freq: Three times a day (TID) | INTRAVENOUS | Status: AC
  Administered 2021-10-30 (×2): 2 g via INTRAVENOUS
  Filled 2021-10-30 (×2): qty 100

## 2021-10-30 MED ORDER — CHLORHEXIDINE GLUCONATE 0.12 % MT SOLN
15.0000 mL | Freq: Once | OROMUCOSAL | Status: AC
  Administered 2021-10-30: 15 mL via OROMUCOSAL
  Filled 2021-10-30: qty 15

## 2021-10-30 MED ORDER — ACETAMINOPHEN 500 MG PO TABS
1000.0000 mg | ORAL_TABLET | Freq: Once | ORAL | Status: AC
  Administered 2021-10-30: 1000 mg via ORAL
  Filled 2021-10-30: qty 2

## 2021-10-30 MED ORDER — ACETAMINOPHEN 160 MG/5ML PO SOLN: 1000.0000 mg | Freq: Four times a day (QID) | ORAL | Status: DC

## 2021-10-30 MED ORDER — FENTANYL CITRATE (PF) 100 MCG/2ML IJ SOLN
25.0000 ug | INTRAMUSCULAR | Status: DC | PRN
  Administered 2021-10-30: 50 ug via INTRAVENOUS

## 2021-10-30 MED ORDER — CHOLESTYRAMINE 4 G PO PACK
4.0000 g | PACK | Freq: Every day | ORAL | Status: DC
  Administered 2021-10-30 – 2021-11-01 (×3): 4 g via ORAL
  Filled 2021-10-30 (×4): qty 1

## 2021-10-30 MED ORDER — CEFAZOLIN SODIUM-DEXTROSE 2-4 GM/100ML-% IV SOLN
2.0000 g | INTRAVENOUS | Status: AC
  Administered 2021-10-30: 2 g via INTRAVENOUS
  Filled 2021-10-30: qty 100

## 2021-10-30 MED ORDER — MIDAZOLAM HCL 2 MG/2ML IJ SOLN
INTRAMUSCULAR | Status: AC
  Filled 2021-10-30: qty 2

## 2021-10-30 MED ORDER — LACTATED RINGERS IV SOLN: INTRAVENOUS | Status: DC | PRN

## 2021-10-30 MED ORDER — BISACODYL 5 MG PO TBEC
10.0000 mg | DELAYED_RELEASE_TABLET | Freq: Every day | ORAL | Status: DC
  Administered 2021-10-30 – 2021-11-02 (×4): 10 mg via ORAL
  Filled 2021-10-30 (×3): qty 2

## 2021-10-30 MED ORDER — PROPOFOL 10 MG/ML IV BOLUS
INTRAVENOUS | Status: AC
  Filled 2021-10-30: qty 20

## 2021-10-30 MED ORDER — PHENYLEPHRINE 40 MCG/ML (10ML) SYRINGE FOR IV PUSH (FOR BLOOD PRESSURE SUPPORT)
PREFILLED_SYRINGE | INTRAVENOUS | Status: DC | PRN
Start: 2021-10-30 — End: 2021-10-30
  Administered 2021-10-30: 80 ug via INTRAVENOUS

## 2021-10-30 MED ORDER — SODIUM CHLORIDE FLUSH 0.9 % IV SOLN
INTRAVENOUS | Status: DC | PRN
  Administered 2021-10-30: 100 mL

## 2021-10-30 MED ORDER — ENOXAPARIN SODIUM 40 MG/0.4ML IJ SOSY
40.0000 mg | PREFILLED_SYRINGE | Freq: Every day | INTRAMUSCULAR | Status: DC
  Filled 2021-10-30 (×2): qty 0.4

## 2021-10-30 MED ORDER — PHENYLEPHRINE HCL-NACL 20-0.9 MG/250ML-% IV SOLN
INTRAVENOUS | Status: DC | PRN
  Administered 2021-10-30: 50 ug/min via INTRAVENOUS

## 2021-10-30 MED ORDER — KETOROLAC TROMETHAMINE 15 MG/ML IJ SOLN
15.0000 mg | Freq: Four times a day (QID) | INTRAMUSCULAR | Status: DC
  Filled 2021-10-30: qty 1

## 2021-10-30 MED ORDER — 0.9 % SODIUM CHLORIDE (POUR BTL) OPTIME
TOPICAL | Status: DC | PRN
Start: 2021-10-30 — End: 2021-10-30
  Administered 2021-10-30: 2000 mL

## 2021-10-30 MED ORDER — OXYCODONE HCL 5 MG PO TABS
5.0000 mg | ORAL_TABLET | ORAL | Status: DC | PRN
  Administered 2021-10-30 – 2021-11-02 (×9): 10 mg via ORAL
  Filled 2021-10-30 (×9): qty 2

## 2021-10-30 MED ORDER — LIDOCAINE 2% (20 MG/ML) 5 ML SYRINGE
INTRAMUSCULAR | Status: DC | PRN
  Administered 2021-10-30: 100 mg via INTRAVENOUS

## 2021-10-30 MED ORDER — SENNOSIDES-DOCUSATE SODIUM 8.6-50 MG PO TABS
1.0000 | ORAL_TABLET | Freq: Every day | ORAL | Status: DC
  Administered 2021-10-30 – 2021-11-01 (×3): 1 via ORAL
  Filled 2021-10-30 (×3): qty 1

## 2021-10-30 MED ORDER — FENTANYL CITRATE (PF) 100 MCG/2ML IJ SOLN
INTRAMUSCULAR | Status: AC
  Filled 2021-10-30: qty 2

## 2021-10-30 MED ORDER — FAMOTIDINE 20 MG PO TABS
10.0000 mg | ORAL_TABLET | Freq: Every evening | ORAL | Status: DC
  Administered 2021-10-30 – 2021-11-01 (×3): 10 mg via ORAL
  Filled 2021-10-30 (×4): qty 1

## 2021-10-30 MED ORDER — MIDAZOLAM HCL 2 MG/2ML IJ SOLN
INTRAMUSCULAR | Status: DC | PRN
  Administered 2021-10-30: 2 mg via INTRAVENOUS

## 2021-10-30 MED ORDER — FENTANYL CITRATE (PF) 250 MCG/5ML IJ SOLN
INTRAMUSCULAR | Status: DC | PRN
Start: 2021-10-30 — End: 2021-10-30
  Administered 2021-10-30: 50 ug via INTRAVENOUS
  Administered 2021-10-30: 200 ug via INTRAVENOUS
  Administered 2021-10-30: 50 ug via INTRAVENOUS

## 2021-10-30 SURGICAL SUPPLY — 114 items
ADH SKN CLS APL DERMABOND .7 (GAUZE/BANDAGES/DRESSINGS) ×1
APL PRP STRL LF DISP 70% ISPRP (MISCELLANEOUS) ×1
BAG SPEC RTRVL C1550 15 (MISCELLANEOUS) ×1
BLADE CLIPPER SURG (BLADE) ×3 IMPLANT
BLADE SURG 11 STRL SS (BLADE) ×3 IMPLANT
CANISTER SUCT 3000ML PPV (MISCELLANEOUS) ×6 IMPLANT
CANNULA REDUC XI 12-8 STAPL (CANNULA) ×4
CANNULA REDUCER 12-8 DVNC XI (CANNULA) ×4 IMPLANT
CATH THORACIC 28FR (CATHETERS) ×1 IMPLANT
CATH TROCAR 20FR (CATHETERS) IMPLANT
CHLORAPREP W/TINT 26 (MISCELLANEOUS) ×3 IMPLANT
CNTNR URN SCR LID CUP LEK RST (MISCELLANEOUS) ×10 IMPLANT
CONN ST 1/4X3/8  BEN (MISCELLANEOUS)
CONN ST 1/4X3/8 BEN (MISCELLANEOUS) IMPLANT
CONT SPEC 4OZ STRL OR WHT (MISCELLANEOUS) ×10
DEFOGGER SCOPE WARMER CLEARIFY (MISCELLANEOUS) ×3 IMPLANT
DERMABOND ADVANCED (GAUZE/BANDAGES/DRESSINGS) ×1
DERMABOND ADVANCED .7 DNX12 (GAUZE/BANDAGES/DRESSINGS) ×2 IMPLANT
DISSECTOR BLUNT TIP ENDO 5MM (MISCELLANEOUS) IMPLANT
DRAIN CHANNEL 28F RND 3/8 FF (WOUND CARE) IMPLANT
DRAPE ARM DVNC X/XI (DISPOSABLE) ×8 IMPLANT
DRAPE COLUMN DVNC XI (DISPOSABLE) ×2 IMPLANT
DRAPE CV SPLIT W-CLR ANES SCRN (DRAPES) ×3 IMPLANT
DRAPE DA VINCI XI ARM (DISPOSABLE) ×8
DRAPE DA VINCI XI COLUMN (DISPOSABLE) ×2
DRAPE ORTHO SPLIT 77X108 STRL (DRAPES) ×2
DRAPE SURG ORHT 6 SPLT 77X108 (DRAPES) ×2 IMPLANT
ELECT BLADE 6.5 EXT (BLADE) ×1 IMPLANT
ELECT REM PT RETURN 9FT ADLT (ELECTROSURGICAL) ×2
ELECTRODE REM PT RTRN 9FT ADLT (ELECTROSURGICAL) ×2 IMPLANT
GAUZE 4X4 16PLY ~~LOC~~+RFID DBL (SPONGE) ×3 IMPLANT
GAUZE KITTNER 4X5 RF (MISCELLANEOUS) ×3 IMPLANT
GAUZE KITTNER 4X8 (MISCELLANEOUS) ×3 IMPLANT
GAUZE SPONGE 4X4 12PLY STRL (GAUZE/BANDAGES/DRESSINGS) ×3 IMPLANT
GLOVE SURG ENC MOIS LTX SZ7.5 (GLOVE) ×6 IMPLANT
GLOVE SURG POLYISO LF SZ8 (GLOVE) ×3 IMPLANT
GOWN STRL REUS W/ TWL LRG LVL3 (GOWN DISPOSABLE) ×4 IMPLANT
GOWN STRL REUS W/ TWL XL LVL3 (GOWN DISPOSABLE) ×6 IMPLANT
GOWN STRL REUS W/TWL 2XL LVL3 (GOWN DISPOSABLE) ×3 IMPLANT
GOWN STRL REUS W/TWL LRG LVL3 (GOWN DISPOSABLE) ×6
GOWN STRL REUS W/TWL XL LVL3 (GOWN DISPOSABLE) ×6
HEMOSTAT SURGICEL 2X14 (HEMOSTASIS) ×8 IMPLANT
IRRIGATION STRYKERFLOW (MISCELLANEOUS) IMPLANT
IRRIGATOR STRYKERFLOW (MISCELLANEOUS)
KIT BASIN OR (CUSTOM PROCEDURE TRAY) ×3 IMPLANT
KIT TURNOVER KIT B (KITS) ×3 IMPLANT
NEEDLE 22X1 1/2 (OR ONLY) (NEEDLE) ×3 IMPLANT
NS IRRIG 1000ML POUR BTL (IV SOLUTION) ×8 IMPLANT
PACK CHEST (CUSTOM PROCEDURE TRAY) ×3 IMPLANT
PAD ARMBOARD 7.5X6 YLW CONV (MISCELLANEOUS) ×15 IMPLANT
PENCIL BUTTON HOLSTER BLD 10FT (ELECTRODE) ×1 IMPLANT
PORT ACCESS TROCAR AIRSEAL 12 (TROCAR) ×2 IMPLANT
PORT ACCESS TROCAR AIRSEAL 5M (TROCAR) ×1
POUCH ENDO CATCH II 15MM (MISCELLANEOUS) IMPLANT
RELOAD STAPLE 45 2.5 WHT DVNC (STAPLE) IMPLANT
RELOAD STAPLE 45 3.5 BLU DVNC (STAPLE) IMPLANT
RELOAD STAPLE 45 4.3 GRN DVNC (STAPLE) IMPLANT
RELOAD STAPLER 2.5X45 WHT DVNC (STAPLE) ×5 IMPLANT
RELOAD STAPLER 3.5X45 BLU DVNC (STAPLE) ×4 IMPLANT
RELOAD STAPLER 4.3X45 GRN DVNC (STAPLE) ×1 IMPLANT
RETRACTOR WOUND ALXS 19CM XSML (INSTRUMENTS) IMPLANT
RTRCTR WOUND ALEXIS 19CM XSML (INSTRUMENTS)
SCISSORS LAP 5X35 DISP (ENDOMECHANICALS) IMPLANT
SEAL CANN UNIV 5-8 DVNC XI (MISCELLANEOUS) ×4 IMPLANT
SEAL XI 5MM-8MM UNIVERSAL (MISCELLANEOUS) ×4
SEALANT PROGEL (MISCELLANEOUS) IMPLANT
SEALER LIGASURE MARYLAND 30 (ELECTROSURGICAL) IMPLANT
SET TRI-LUMEN FLTR TB AIRSEAL (TUBING) ×3 IMPLANT
SOLUTION ELECTROLUBE (MISCELLANEOUS) ×1 IMPLANT
SPONGE INTESTINAL PEANUT (DISPOSABLE) IMPLANT
SPONGE T-LAP 18X18 ~~LOC~~+RFID (SPONGE) ×10 IMPLANT
STAPLER 45 SUREFORM CVD (STAPLE) ×2
STAPLER 45 SUREFORM CVD DVNC (STAPLE) IMPLANT
STAPLER CANNULA SEAL DVNC XI (STAPLE) ×4 IMPLANT
STAPLER CANNULA SEAL XI (STAPLE) ×4
STAPLER RELOAD 2.5X45 WHITE (STAPLE) ×10
STAPLER RELOAD 2.5X45 WHT DVNC (STAPLE) ×5
STAPLER RELOAD 3.5X45 BLU DVNC (STAPLE) ×4
STAPLER RELOAD 3.5X45 BLUE (STAPLE) ×8
STAPLER RELOAD 4.3X45 GREEN (STAPLE) ×2
STAPLER RELOAD 4.3X45 GRN DVNC (STAPLE) ×1
STOPCOCK 4 WAY LG BORE MALE ST (IV SETS) ×3 IMPLANT
SUT MNCRL AB 3-0 PS2 18 (SUTURE) IMPLANT
SUT MON AB 2-0 CT1 36 (SUTURE) IMPLANT
SUT PDS AB 1 CTX 36 (SUTURE) IMPLANT
SUT PROLENE 4 0 RB 1 (SUTURE)
SUT PROLENE 4-0 RB1 .5 CRCL 36 (SUTURE) IMPLANT
SUT SILK  1 MH (SUTURE) ×2
SUT SILK 1 MH (SUTURE) ×2 IMPLANT
SUT SILK 1 TIES 10X30 (SUTURE) IMPLANT
SUT SILK 2 0 SH (SUTURE) ×1 IMPLANT
SUT SILK 2 0SH CR/8 30 (SUTURE) IMPLANT
SUT VIC AB 1 CTX 36 (SUTURE)
SUT VIC AB 1 CTX36XBRD ANBCTR (SUTURE) IMPLANT
SUT VIC AB 2-0 CT1 27 (SUTURE) ×2
SUT VIC AB 2-0 CT1 TAPERPNT 27 (SUTURE) ×2 IMPLANT
SUT VIC AB 3-0 SH 27 (SUTURE) ×4
SUT VIC AB 3-0 SH 27X BRD (SUTURE) ×4 IMPLANT
SUT VICRYL 0 TIES 12 18 (SUTURE) ×3 IMPLANT
SUT VICRYL 0 UR6 27IN ABS (SUTURE) ×6 IMPLANT
SUT VICRYL 2 TP 1 (SUTURE) IMPLANT
SYR 10ML LL (SYRINGE) ×3 IMPLANT
SYR 20ML LL LF (SYRINGE) ×3 IMPLANT
SYR 50ML LL SCALE MARK (SYRINGE) ×3 IMPLANT
SYSTEM RETRIEVAL ANCHOR 15 (MISCELLANEOUS) ×1 IMPLANT
SYSTEM SAHARA CHEST DRAIN ATS (WOUND CARE) ×3 IMPLANT
TAPE CLOTH 4X10 WHT NS (GAUZE/BANDAGES/DRESSINGS) ×3 IMPLANT
TAPE CLOTH SURG 4X10 WHT LF (GAUZE/BANDAGES/DRESSINGS) ×1 IMPLANT
TIP APPLICATOR SPRAY EXTEND 16 (VASCULAR PRODUCTS) IMPLANT
TOWEL GREEN STERILE (TOWEL DISPOSABLE) ×3 IMPLANT
TRAY FOLEY MTR SLVR 14FR STAT (SET/KITS/TRAYS/PACK) ×1 IMPLANT
TRAY FOLEY MTR SLVR 16FR STAT (SET/KITS/TRAYS/PACK) ×3 IMPLANT
TUBING EXTENTION W/L.L. (IV SETS) ×3 IMPLANT
WATER STERILE IRR 1000ML POUR (IV SOLUTION) ×2 IMPLANT

## 2021-10-30 NOTE — Brief Op Note (Signed)
10/30/2021  11:16 AM  PATIENT:  Bonnie Harper  46 y.o. female  PRE-OPERATIVE DIAGNOSIS:  LUNG CANCER  POST-OPERATIVE DIAGNOSIS:  LUNG CANCER  PROCEDURE:  Procedure(s):  XI ROBOTIC ASSISTED THORASCOPY-  RIGHT UPPER LOBECTOMY (Right)  INTERCOSTAL NERVE BLOCK (Right)  NODE DISSECTION (Right)  SURGEON:  Surgeon(s) and Role:    * Lightfoot, Lucile Crater, MD - Primary  PHYSICIAN ASSISTANT: Ellwood Handler PA-C   ASSISTANTS: none   ANESTHESIA:   general  EBL:  Per Anesthesia Record   BLOOD ADMINISTERED:none  DRAINS:  28 Straight Chest Tube    LOCAL MEDICATIONS USED:  Exparel  SPECIMEN:  Source of Specimen:  Right upper lobe, lymph node  DISPOSITION OF SPECIMEN:  PATHOLOGY  COUNTS:  YES  TOURNIQUET:  * No tourniquets in log *  DICTATION: .Dragon Dictation  PLAN OF CARE: Admit to inpatient   PATIENT DISPOSITION:  PACU - hemodynamically stable.   Delay start of Pharmacological VTE agent (>24hrs) due to surgical blood loss or risk of bleeding: no

## 2021-10-30 NOTE — Hospital Course (Addendum)
History of Present Illness:  Bonnie Harper is a 46 yo female with known history of breast cancer S/p bilateral mastectomy performed in 2008, history of thyroid cancer s/p thyroidectomy in 2011.  She underwent CT scan of her chest in November of last year.  This showed a 1 cm irregular spiculated nodule.  There was also a smaller 4 mm subpleural nodule and an additional left lower lobe 3 mm subpleural nodule.  She had recently had a PET CT in August of last  year which showed the right upper lobe nodule to have a low level of activity.  She was referred to Dr. [name] who felt the nodule warranted biopsy.  He felt she should undergo robotic assisted navigational bronchoscopy and she was agreeable to proceed.  This was performed on 08/29/2021 and confirmed a new lung primary cancer.  She was referred to [facility] for surgical evaluation.  She was evaluated by Dr. Kipp Brood in [place] who recommended surgical resection with lobectomy.  This could be performed robotically, the risks and benefits of the procedure were explained to the patient and she was agreeable to proceed.  Hospital Course:  Bonnie Harper presented to [facility] on 10/30/2021.  She was taken to the operating room and underwent Robotic Assisted Video Thoracoscopy with Right Upper Lobectomy, Lymph Node Dissection, and intercostal nerve block.  She tolerated the procedure without difficulty and was taken to the SICU in stable condition.  The patient did well post operatively.  The patient's chest tube was free from air leak.  CXR showed no evidence of pneumothorax.  Her chest tube was clamped and follow up CXR showed increase in pneumothorax.  Due to this her chest tube was unclamped and remained on water seal.  Subsequent CXR showed improvement of apical pneumothorax.  Her chest tube was removed without difficulty on 11/02/2021.  Her surgical incisions are healing without evidence of infection.  She is ambulating  independently.  She is medically stable for discharge home today.

## 2021-10-30 NOTE — Op Note (Signed)
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 The assistant was needed for exposure, dissection, suctioning, retraction of delicate tissues and sutures, instrument exchange and for overall help during this procedure.    Anesthesia  general EBL: 100 ml Blood Administration: None Specimen: Right upper lobe, levels 4, 7, and hilar nodes  Drains: 47 F argyle chest tube in right chest Counts: correct   Indications: 46 yo female with history of breast and thyroid cancer who also has multiple bilateral pulmonary nodules.  The largest of which is 1 cm in the right upper lobe.  I personally reviewed her cross-sectional imaging most of the pulmonary nodules are subcentimeter and on her PET/CT from August of this year she did have mild activity in the 1 cm nodule.     Pathology results are consistent with primary lung cancer.  We discussed the risks and benefits of surgical resection which will include a right robotic assisted thoracoscopy with left upper lobectomy.  She is agreeable to proceed.  Findings: Diaphragmatic and pericardial adhesions.  The upper lobe was also adherent to the underside of the sternum.  Operative Technique: After the risks, benefits and alternatives were thoroughly discussed, the patient was brought to the operative theatre.  Anesthesia was induced, and the patient was then placed in a left lateral decubitus position and was prepped and draped in  normal sterile fashion.  An appropriate surgical pause was performed, and pre-operative antibiotics were dosed accordingly.  We began by placing our 4 robotic ports in the the 7th intercostal space targeting the hilum of the lung.  A 32mm assistant port was placed in the 9th intercostal space in the anterior axillary line.  The robot was then docked and all instruments were passed under direct visualization.    The lung was then retracted superiorly, and the inferior pulmonary ligament was divided.  The hilum was mobilized anteriorly and posteriorly.  We identified the upper lobe pulmonary vein, and after careful isolation, it was divided with a vascular stapler.  We next moved to the pulmonary artery.  The artery was then divided with a vascular load stapler.  We then entered the fissure and identified the recurrent branch and divided it with the robotic stapler.  The bronchus to the upper lobe was then isolated.  After a test clamp, with good ventilation of the lower and middle lobes, the bronchus was then divided.  The fissure was completed, and the specimen was passed into an endocatch bag.  It was removed from the anterior access site.    Lymph nodes were then sampled at levels 4, and 7.  The chest was irrigated, and an air leak test was performed.  An intercostal nerve block was performed under direct visualization.  A 28 F chest with then placed, and we watch the remaining lobes re-expand.  The skin and soft tissue were closed  with absorbable suture    The patient tolerated the procedure without any immediate complications, and was transferred to the PACU in stable condition.  Bonnie Harper Bary Leriche

## 2021-10-30 NOTE — Anesthesia Postprocedure Evaluation (Signed)
Anesthesia Post Note  Patient: Bonnie Harper  Procedure(s) Performed: XI ROBOTIC ASSISTED THORASCOPY-RIGHT UPPER LOBECTOMY (Right: Chest) INTERCOSTAL NERVE BLOCK (Right: Chest) NODE DISSECTION (Right: Chest)     Patient location during evaluation: PACU Anesthesia Type: General Level of consciousness: awake Pain management: pain level controlled Vital Signs Assessment: post-procedure vital signs reviewed and stable Respiratory status: spontaneous breathing, nonlabored ventilation, respiratory function stable and patient connected to nasal cannula oxygen Cardiovascular status: blood pressure returned to baseline and stable Postop Assessment: no apparent nausea or vomiting Anesthetic complications: no   No notable events documented.  Last Vitals:  Vitals:   10/30/21 1711 10/30/21 2005  BP: 130/78 104/76  Pulse: 74 78  Resp: 18 14  Temp: 36.9 C 36.8 C  SpO2: 98% 98%    Last Pain:  Vitals:   10/30/21 2005  TempSrc: Oral  PainSc:                  Karyl Kinnier Adiel Mcnamara

## 2021-10-30 NOTE — Anesthesia Procedure Notes (Addendum)
Procedure Name: Intubation Date/Time: 10/30/2021 8:10 AM Performed by: Lavell Luster, CRNA Pre-anesthesia Checklist: Patient identified, Emergency Drugs available, Suction available, Patient being monitored and Timeout performed Patient Re-evaluated:Patient Re-evaluated prior to induction Oxygen Delivery Method: Circle system utilized Preoxygenation: Pre-oxygenation with 100% oxygen Induction Type: IV induction Ventilation: Mask ventilation without difficulty Laryngoscope Size: Mac, 3 and Glidescope Grade View: Grade II Endobronchial tube: Bronchial Blocker placed under direct vision Tube size: 8.0 mm Number of attempts: 3 Airway Equipment and Method: Stylet and Video-laryngoscopy Placement Confirmation: ETT inserted through vocal cords under direct vision, positive ETCO2 and breath sounds checked- equal and bilateral Secured at: 22 cm Tube secured with: Tape Dental Injury: Teeth and Oropharynx as per pre-operative assessment  Difficulty Due To: Difficulty was anticipated, Difficult Airway- due to anterior larynx and Difficult Airway- due to limited oral opening Future Recommendations: Recommend- induction with short-acting agent, and alternative techniques readily available Comments: Attempted DL with 37 Fr DLT, good view of cords but ETT would not pass.  Dr Roanna Banning attemtpted DL with Sabra Heck 2 with same view and DLT would not pass. DL with MAC 3 blade, same view attempted 35 Fr DLT with same issue.  ETT would not pass.  Switched to videolaryngoscope and 8.0 ETT and bronchial blocker passed by Dr Roanna Banning and [place].  Henderson Cloud, CRNA

## 2021-10-30 NOTE — H&P (Signed)
[address]       [place] [address]             [phone]                                                   Dazha L Colledge [place] Medical Record #[id number] Date of Birth: 02-14-1976   Referring: [name], DO Primary Care: [name], MD Primary Cardiologist: None   Chief Complaint:        Chief Complaint  Patient presents with   Lung Lesion      Surgical consult, Chest CT 08/02/21, PFT's 09/08/21, Bronch 08/29/21, PET Scan 05/17/21    No events since her last clinic appointment  Vitals:   10/30/21 0611  BP: (!) 146/91  Pulse: 81  Resp: 18  Temp: 97.8 F (36.6 C)  SpO2: 100%   Alert NAD Sinus  EWOB  OR today for R RATS, RULectomy  [name]  Per my last clinic note    History of Present Illness:    Tiny L Propes 46 y.o. female referred by Dr. [name] for surgical evaluation of a right upper lobe biopsy-proven adenocarcinoma.  She does have a history of breast cancer as well as thyroid cancer with papillary adenocarcinoma histology.  The right upper lobe biopsy was performed and additional stains were consistent with pulmonary primary.  She denies any shortness of breath.  She denies any neurologic symptoms.       Smoking Hx: age [age] smoked a few cigarettes a day     Zubrod Score: At the time of surgery this patients most appropriate activity status/level should be described as: [x]     0    Normal activity, no symptoms []     1    Restricted in physical strenuous activity but ambulatory, able to do out light work []     2    Ambulatory and capable of self care, unable to do work activities, up and about               >50 % of waking hours                              []     3    Only limited self care, in bed greater than 50% of waking hours []     4    Completely disabled, no self care, confined to bed or chair []     5    Moribund         Past Medical History:  Diagnosis Date   Anxiety     Breast  cancer ([place]) 02/28/2007    Right breast s/p bilat mastectomies   Cancer ([place]) 11/03/2009    Thyroid cancer s/p thyroidectomy    IBS (irritable bowel syndrome)     Thyroid disease             Past Surgical History:  Procedure Laterality Date   BREAST RECONSTRUCTION Bilateral     BRONCHIAL BIOPSY   08/29/2021    Procedure: BRONCHIAL BIOPSIES;  Surgeon: [name], DO;  Location: [facility];  Service: Pulmonary;;   BRONCHIAL BRUSHINGS   08/29/2021    Procedure: BRONCHIAL BRUSHINGS;  Surgeon: [name], DO;  [facility]  END[facility]COPY;  Service: Pulmonary;;   BRONCHIAL NEEDLE ASPIRATION BIOPSY   08/29/2021    Procedure: BRONCHIAL NEEDLE ASPIRATION BIOPSIES;  Surgeon: [name], DO;  Location: [facility];  Service: Pulmonary;;   CESAREAN SECTION        C-section x 2   CHOLECYSTECTOMY       FIDUCIAL MARKER PLACEMENT   08/29/2021    Procedure: FIDUCIAL MARKER PLACEMENT;  Surgeon: [name], DO;  Location: [facility];  Service: Pulmonary;;   IMPLANT OF A SILASTIC METATARSAL PHALANGE JOINT Left 2021    3 titanium rods in joint   MASTECTOMY Bilateral 08/14/2007   PORT-A-CATH REMOVAL   08/14/2007   THYROIDECTOMY   11/03/2009   TONSILLECTOMY        removed as a child           Family History  Problem Relation Age of Onset   Stroke Father 59   Diabetes Father     Asthma Father     Cancer Maternal Uncle 84        melanoma   Cancer Maternal Grandmother 46        bilateral breast cancer   Skin cancer Maternal Grandmother          BCC, SCC, and melanoma    Cancer Other 37        mat great aunt through [facility] with breast cancer   Cancer Other 4        maternal great aunt through [facility] with breast cancer at age 42, ovarian ca at 70 and 2nd breast primary in 28s   Lung cancer Paternal Uncle 62   Cancer Paternal Grandfather          oral; dx late 28s        Social History        Tobacco Use  Smoking Status Former   Types: Cigarettes   Quit date:  04/04/2002   Years since quitting: 19.4  Smokeless Tobacco Never    Social History        Substance and Sexual Activity  Alcohol Use Yes   Alcohol/week: 2.0 standard drinks   Types: 2 Glasses of wine per week             Allergies  Allergen Reactions   Zofran Other (See Comments)      migraines   Sertraline Other (See Comments)      Migraines            Current Outpatient Medications  Medication Sig Dispense Refill   acetaminophen (TYLENOL) 325 MG tablet Take 325 mg by mouth every 4 (four) hours as needed for headache, mild pain or moderate pain.       cetirizine (ZYRTEC) 10 MG tablet Take 10 mg by mouth daily as needed for allergies.       Cholecalciferol (VITAMIN D PO) Take 5,000 Units by mouth daily.       cholestyramine (QUESTRAN) 4 G packet Take 4 g by mouth daily.       Famotidine (PEPCID AC MAXIMUM STRENGTH PO) Take 10 mg by mouth daily.       levothyroxine (SYNTHROID) 112 MCG tablet Take 112 mcg by mouth daily before breakfast.       liothyronine (CYTOMEL) 5 MCG tablet Take 5 mcg by mouth daily.   4   LORazepam (ATIVAN) 1 MG tablet Take 1 mg by mouth daily as needed for anxiety.       Magnesium 250 MG TABS Take 250 mg by  mouth daily.       naproxen sodium (ALEVE) 220 MG tablet Take 220 mg by mouth every 12 (twelve) hours as needed (Headache and back pain).       vitamin C (ASCORBIC ACID) 500 MG tablet Take 500 mg by mouth daily.        No current facility-administered medications for this visit.      Review of Systems  Constitutional: Negative.   Respiratory: Negative.    Cardiovascular: Negative.   Neurological: Negative.       PHYSICAL EXAMINATION: BP 140/82    Pulse 68    Ht 5\' 4"  (1.626 m)    Wt 178 lb (80.7 kg)    SpO2 100% Comment: RA   BMI 30.55 kg/m  Physical Exam Constitutional:      General: She is not in acute distress.    Appearance: Normal appearance. She is normal weight. She is not ill-appearing.  HENT:     Head: Normocephalic and  atraumatic.  Eyes:     Extraocular Movements: Extraocular movements intact.  Cardiovascular:     Rate and Rhythm: Normal rate.     Heart sounds: No murmur heard. Pulmonary:     Effort: Pulmonary effort is normal. No respiratory distress.     Breath sounds: Normal breath sounds.  Abdominal:     General: Abdomen is flat. There is no distension.  Musculoskeletal:     Cervical back: Normal range of motion.  Skin:    General: Skin is warm and dry.  Neurological:     General: No focal deficit present.     Mental Status: She is alert and oriented to person, place, and time.      Diagnostic Studies & Laboratory data:     Recent Radiology Findings:    Imaging Results  DG CHEST PORT 1 VIEW   Result Date: 08/29/2021 CLINICAL DATA:  Status post bronchoscopy with biopsy. EXAM: PORTABLE CHEST 1 VIEW COMPARISON:  CT of the chest 08/02/2021 chest x-ray 07/17/2007. FINDINGS: Surgical clips overlie the right hilar region and right adnexa. The heart size and mediastinal contours are within normal limits. Both lungs are clear. The visualized skeletal structures are unremarkable. IMPRESSION: No active disease. Electronically Signed   By: Ronney Asters M.D.   On: 08/29/2021 16:01    DG C-ARM BRONCHOSCOPY   Result Date: 08/29/2021 C-ARM BRONCHOSCOPY: Fluoroscopy was utilized by the requesting physician.  No radiographic interpretation.           I have independently reviewed the above radiology studies  and reviewed the findings with the patient.    Recent Lab Findings: Recent Labs       Lab Results  Component Value Date    WBC 7.4 08/29/2021    HGB 12.0 08/29/2021    HCT 37.6 08/29/2021    PLT 296 08/29/2021    GLUCOSE 94 09/06/2016    ALT 19 09/06/2016    AST 17 09/06/2016    NA 141 09/06/2016    K 4.2 09/06/2016    CL 103 06/16/2010    CREATININE 0.8 09/06/2016    BUN 8.1 09/06/2016    CO2 25 09/06/2016    TSH 2.245 07/28/2007    INR 1.00 (L) 04/23/2007          PFTs:    - FVC: 95% - FEV1: 90% -DLCO: 118%   Problem List: Right upper lobe 1 cm adenocarcinoma. Additional subcentimeter pulmonary nodules in an the right upper lobe. History of bilateral mastectomies with breast  implants.           Assessment / Plan:   46 yo female with history of breast and thyroid cancer who also has multiple bilateral pulmonary nodules.  The largest of which is 1 cm in the right upper lobe.  I personally reviewed her cross-sectional imaging most of the pulmonary nodules are subcentimeter and on her PET/CT from August of this year she did have mild activity in the 1 cm nodule.     Pathology results are consistent with primary lung cancer.  We discussed the risks and benefits of surgical resection which will include a right robotic assisted thoracoscopy with left upper lobectomy.  She is agreeable to proceed.  She is tentatively scheduled for late January.

## 2021-10-30 NOTE — Transfer of Care (Signed)
Immediate Anesthesia Transfer of Care Note  Patient: Bonnie Harper  Procedure(s) Performed: XI ROBOTIC ASSISTED THORASCOPY-RIGHT UPPER LOBECTOMY (Right: Chest) INTERCOSTAL NERVE BLOCK (Right: Chest) NODE DISSECTION (Right: Chest)  Patient Location: PACU  Anesthesia Type:General  Level of Consciousness: awake, alert  and oriented  Airway & Oxygen Therapy: Patient Spontanous Breathing  Post-op Assessment: Post -op Vital signs reviewed and stable  Post vital signs: stable  Last Vitals:  Vitals Value Taken Time  BP 88/76 10/30/21 1136  Temp    Pulse 91 10/30/21 1138  Resp 17 10/30/21 1138  SpO2 99 % 10/30/21 1138  Vitals shown include unvalidated device data.  Last Pain:  Vitals:   10/30/21 0645  TempSrc:   PainSc: 0-No pain         Complications: No notable events documented.

## 2021-10-31 ENCOUNTER — Encounter (HOSPITAL_COMMUNITY): Payer: Self-pay | Admitting: Thoracic Surgery (Cardiothoracic Vascular Surgery)

## 2021-10-31 ENCOUNTER — Inpatient Hospital Stay (HOSPITAL_COMMUNITY): Payer: No Typology Code available for payment source

## 2021-10-31 LAB — CBC
HCT: 31.1 % — ABNORMAL LOW (ref 36.0–46.0)
Hemoglobin: 9.9 g/dL — ABNORMAL LOW (ref 12.0–15.0)
MCH: 27.2 pg (ref 26.0–34.0)
MCHC: 31.8 g/dL (ref 30.0–36.0)
MCV: 85.4 fL (ref 80.0–100.0)
Platelets: 264 10*3/uL (ref 150–400)
RBC: 3.64 MIL/uL — ABNORMAL LOW (ref 3.87–5.11)
RDW: 14.5 % (ref 11.5–15.5)
WBC: 8.7 10*3/uL (ref 4.0–10.5)
nRBC: 0 % (ref 0.0–0.2)

## 2021-10-31 LAB — BASIC METABOLIC PANEL
Anion gap: 9 (ref 5–15)
BUN: 9 mg/dL (ref 6–20)
CO2: 22 mmol/L (ref 22–32)
Calcium: 8.5 mg/dL — ABNORMAL LOW (ref 8.9–10.3)
Chloride: 104 mmol/L (ref 98–111)
Creatinine, Ser: 0.77 mg/dL (ref 0.44–1.00)
GFR, Estimated: >60 mL/min (ref >60)
Glucose, Bld: 133 mg/dL — ABNORMAL HIGH (ref 70–99)
Potassium: 3.6 mmol/L (ref 3.5–5.1)
Sodium: 135 mmol/L (ref 135–145)

## 2021-10-31 NOTE — Plan of Care (Signed)
°  Problem: Education: Goal: Knowledge of General Education information will improve Description: Including pain rating scale, medication(s)/side effects and non-pharmacologic comfort measures Outcome: Progressing   Problem: Health Behavior/Discharge Planning: Goal: Ability to manage health-related needs will improve Outcome: Progressing   Problem: Clinical Measurements: Goal: Ability to maintain clinical measurements within normal limits will improve Outcome: Progressing Goal: Will remain free from infection Outcome: Progressing Goal: Diagnostic test results will improve Outcome: Progressing Goal: Respiratory complications will improve Outcome: Progressing Goal: Cardiovascular complication will be avoided Outcome: Progressing   Problem: Activity: Goal: Risk for activity intolerance will decrease Outcome: Progressing   Problem: Nutrition: Goal: Adequate nutrition will be maintained Outcome: Progressing   Problem: Coping: Goal: Level of anxiety will decrease Outcome: Progressing   Problem: Elimination: Goal: Will not experience complications related to bowel motility Outcome: Progressing Goal: Will not experience complications related to urinary retention Outcome: Progressing   Problem: Pain Managment: Goal: General experience of comfort will improve Outcome: Progressing   Problem: Safety: Goal: Ability to remain free from injury will improve Outcome: Progressing   Problem: Skin Integrity: Goal: Risk for impaired skin integrity will decrease Outcome: Progressing   Problem: Education: Goal: Knowledge of disease or condition will improve Outcome: Progressing Goal: Knowledge of the prescribed therapeutic regimen will improve Outcome: Progressing   Problem: Activity: Goal: Risk for activity intolerance will decrease Outcome: Progressing   Problem: Cardiac: Goal: Will achieve and/or maintain hemodynamic stability Outcome: Progressing   Problem: Clinical  Measurements: Goal: Postoperative complications will be avoided or minimized Outcome: Progressing   Problem: Respiratory: Goal: Respiratory status will improve Outcome: Progressing   Problem: Pain Management: Goal: Pain level will decrease Outcome: Progressing   Problem: Skin Integrity: Goal: Wound healing without signs and symptoms infection will improve Outcome: Progressing

## 2021-10-31 NOTE — TOC Progression Note (Signed)
Transition of Care [facility]) - Progression Note    Patient Details  Name: [name] MRN: [id number] Date of Birth: 02/16/1976  Transition of Care [facility]) CM/SW [place], RN Phone Number:[phone]  10/31/2021, 4:07 PM  Clinical Narrative:     Transition of Care [facility]) Screening Note   Patient Details  Name: [name] Date of Birth: 08/12/1976   Transition of Care [facility]) CM/SW Contact:    Angelita Ingles, RN Phone Number: 10/31/2021, 4:07 PM    Transition of Care [facility]) has reviewed patient and no TOC needs have been identified at this time. We will continue to monitor patient advancement through interdisciplinary progression rounds. If new patient transition needs arise, please place a TOC consult.          Expected Discharge Plan and Services                                                 Social Determinants of Health (SDOH) Interventions    Readmission Risk Interventions No flowsheet data found.

## 2021-10-31 NOTE — Progress Notes (Signed)
Call from [place] that xray result is back, increase in volume of right apical pneumothorax.  Jadene Pierini [place]

## 2021-10-31 NOTE — Discharge Summary (Signed)
Physician Discharge Summary  Patient ID: Bonnie Harper MRN: [id number] DOB/AGE: 46-Nov-1977 46 y.o.  Admit date: 10/30/2021 Discharge date: 11/02/2021  Admission Diagnoses:  Patient Active Problem List   Diagnosis Date Noted   Lung cancer ([place]) 09/15/2021   Lung nodule 08/16/2021   Genetic testing 08/02/2020   History of breast cancer 09/08/2015   History of thyroid cancer 09/08/2015   Family history of malignant neoplasm of breast 03/03/2014   Menopausal syndrome (hot flashes) 03/01/2014   Discharge Diagnoses:   Patient Active Problem List   Diagnosis Date Noted   S/P Robotic Assisted Video Thoracosocpy with Right Upper Lobe 10/30/2021   Lung cancer ([place]) 09/15/2021   Lung nodule 08/16/2021   Genetic testing 08/02/2020   History of breast cancer 09/08/2015   History of thyroid cancer 09/08/2015   Family history of malignant neoplasm of breast 03/03/2014   Menopausal syndrome (hot flashes) 03/01/2014   Discharged Condition: good  History of Present Illness:  Bonnie Harper is a 46 yo female with known history of breast cancer S/p bilateral mastectomy performed in 2008, history of thyroid cancer s/p thyroidectomy in 2011.  She underwent CT scan of her chest in November of last year.  This showed a 1 cm irregular spiculated nodule.  There was also a smaller 4 mm subpleural nodule and an additional left lower lobe 3 mm subpleural nodule.  She had recently had a PET CT in August of last  year which showed the right upper lobe nodule to have a low level of activity.  She was referred to Dr. [name] who felt the nodule warranted biopsy.  He felt she should undergo robotic assisted navigational bronchoscopy and she was agreeable to proceed.  This was performed on 08/29/2021 and confirmed a new lung primary cancer.  She was referred to [facility] for surgical evaluation.  She was evaluated by Dr. Kipp Brood in [place] who recommended surgical resection with  lobectomy.  This could be performed robotically, the risks and benefits of the procedure were explained to the patient and she was agreeable to proceed.  Hospital Course:  Bonnie Harper presented to [facility] on 10/30/2021.  She was taken to the operating room and underwent Robotic Assisted Video Thoracoscopy with Right Upper Lobectomy, Lymph Node Dissection, and intercostal nerve block.  She tolerated the procedure without difficulty and was taken to the SICU in stable condition.  The patient did well post operatively.  The patient's chest tube was free from air leak.  CXR showed no evidence of pneumothorax.  Her chest tube was clamped and follow up CXR showed increase in pneumothorax.  Due to this her chest tube was unclamped and remained on water seal.  Subsequent CXR showed improvement of apical pneumothorax.  Her chest tube was removed without difficulty on 11/02/2021.  Her surgical incisions are healing without evidence of infection.  She is ambulating independently.  She is medically stable for discharge home today.   Consults: None  Significant Diagnostic Studies: nuclear medicine:   IMPRESSION: 1. Low level metabolic activity within the dominant right upper lobe pulmonary nodule, without abnormal metabolic activity in the other numerous bilateral scattered pulmonary nodules which are below the resolution of PET. This is a nonspecific finding but favored to reflect a benign infectious or inflammatory etiology. Consider follow-up dedicated chest CT in 3 months to assess stability.   2. Postsurgical change of bilateral mastectomies with reconstruction and thyroidectomy without suspicious hypermetabolic activity in the surgical beds to suggest  recurrence.   3. Symmetric hypermetabolic hyperplasia of the tonsils with prominent mildly metabolic cervical lymph nodes, favored reactive.   4. No additional suspicious hypermetabolic activity in the neck, chest, abdomen or pelvis.      Electronically Signed   By: Dahlia Bailiff M.D.   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 The assistant was needed for exposure, dissection, suctioning, retraction of delicate tissues and sutures, instrument exchange and for overall help during this procedure.    PATHOLOGY:  SURGICAL PATHOLOGY  CASE: [id number]  PATIENT: Bonnie Harper  Surgical Pathology Report   Clinical History: lung cancer (cm)    FINAL MICROSCOPIC DIAGNOSIS:   A. LUNG, RIGHT UPPER LOBE, LOBECTOMY:  -  Invasive adenocarcinoma, moderately differentiated, 1.1 cm  -  Carcinoma involves the pleura  -  Margins uninvolved by adenocarcinoma  -  No carcinoma identified in four lymph nodes (0/4)  -  Fibrotic foci (x2; mid-portion and apex)  -  See oncology table and comment below   B. LYMPH NODE, LEVEL 4, EXCISION:  -  No carcinoma identified in nodal tissue   C. LYMPH NODE, HILAR, EXCISION:  -  No carcinoma identified in nodal tissue   D. LYMPH NODE, LEVEL 7, EXCISION:  -  No carcinoma identified in nodal tissue   Discharge Exam: Blood pressure (!) 167/99, pulse 72, temperature 98.3 F (36.8 C), resp. rate 19, height 5\' 4"  (1.626 m), weight 81.2 kg, SpO2 99 %.  General appearance: alert, cooperative, and no distress Heart: regular rate and rhythm Lungs: clear to auscultation bilaterally Abdomen: soft, non-tender; bowel sounds normal; no masses,  no organomegaly Extremities: extremities normal, atraumatic, no  cyanosis or edema Wound: clean and dry  Discharge disposition: 01-Home or Self Care  Allergies as of 11/02/2021       Reactions   Zofran Other (See Comments)   migraines   Sertraline Other (See Comments)   Migraines        Medication List     TAKE these medications    acetaminophen 325 MG tablet Commonly known as: TYLENOL Take 650 mg by mouth every 4 (four) hours as needed for headache, mild pain or moderate pain.   cetirizine 10 MG tablet Commonly known as: ZYRTEC Take 10 mg by mouth daily as needed for allergies.   cholestyramine 4 g packet Commonly known as: QUESTRAN Take 4 g by mouth daily with lunch.   levothyroxine 112 MCG tablet Commonly known as: SYNTHROID Take 112 mcg by mouth daily before breakfast.   liothyronine 5 MCG tablet Commonly known as: CYTOMEL Take 5 mcg by mouth daily.   LORazepam 1 MG tablet Commonly known as: ATIVAN Take 1 mg by mouth daily as needed for anxiety.   Magnesium 250 MG Tabs Take 500 mg by mouth every evening.   naproxen sodium 220 MG tablet Commonly known as: ALEVE Take 220 mg by mouth every 12 (twelve) hours as needed (Headache and back pain).   oxyCODONE 5 MG immediate release tablet Commonly known as:  Oxy IR/ROXICODONE Take 1 tablet (5 mg total) by mouth every 4 (four) hours as needed for moderate pain.   PEPCID AC MAXIMUM STRENGTH PO Take 10 mg by mouth every evening.   vitamin C 500 MG tablet Commonly known as: ASCORBIC ACID Take 500 mg by mouth every evening.   VITAMIN D PO Take 5,000 Units by mouth every evening.        Follow-up Information     [name], MD Follow up on 11/10/2021.   Specialty: Cardiothoracic Surgery Why: Appointment is at 11:00 Contact information: [place] [address] [phone]                 Signed: Ellwood Handler, PA-C  11/02/2021, 8:37 AM

## 2021-10-31 NOTE — Progress Notes (Signed)
°   °  [address]       [place] [address]             [phone]       No events, pain controlled  Vitals:   10/31/21 0400 10/31/21 0700  BP: 105/62 121/78  Pulse: 68 75  Resp: 16 15  Temp: 98.2 F (36.8 C) 98.2 F (36.8 C)  SpO2: 96% 96%   Alert NAD Sinus Good tidaling, no obvious air leak  CXR stable  POD 1 s/p R RATS, RULectomy CT clamped If CT out today, will discharge  [place]

## 2021-11-01 ENCOUNTER — Inpatient Hospital Stay (HOSPITAL_COMMUNITY): Payer: No Typology Code available for payment source

## 2021-11-01 LAB — CBC
HCT: 29.7 % — ABNORMAL LOW (ref 36.0–46.0)
Hemoglobin: 9.7 g/dL — ABNORMAL LOW (ref 12.0–15.0)
MCH: 28.1 pg (ref 26.0–34.0)
MCHC: 32.7 g/dL (ref 30.0–36.0)
MCV: 86.1 fL (ref 80.0–100.0)
Platelets: 241 10*3/uL (ref 150–400)
RBC: 3.45 MIL/uL — ABNORMAL LOW (ref 3.87–5.11)
RDW: 14.8 % (ref 11.5–15.5)
WBC: 6.9 10*3/uL (ref 4.0–10.5)
nRBC: 0 % (ref 0.0–0.2)

## 2021-11-01 LAB — COMPREHENSIVE METABOLIC PANEL
ALT: 15 U/L (ref 0–44)
AST: 20 U/L (ref 15–41)
Albumin: 2.9 g/dL — ABNORMAL LOW (ref 3.5–5.0)
Alkaline Phosphatase: 48 U/L (ref 38–126)
Anion gap: 7 (ref 5–15)
BUN: 7 mg/dL (ref 6–20)
CO2: 25 mmol/L (ref 22–32)
Calcium: 8.3 mg/dL — ABNORMAL LOW (ref 8.9–10.3)
Chloride: 107 mmol/L (ref 98–111)
Creatinine, Ser: 0.7 mg/dL (ref 0.44–1.00)
GFR, Estimated: >60 mL/min (ref >60)
Glucose, Bld: 102 mg/dL — ABNORMAL HIGH (ref 70–99)
Potassium: 3.5 mmol/L (ref 3.5–5.1)
Sodium: 139 mmol/L (ref 135–145)
Total Bilirubin: 0.3 mg/dL (ref 0.3–1.2)
Total Protein: 5.6 g/dL — ABNORMAL LOW (ref 6.5–8.1)

## 2021-11-01 NOTE — Progress Notes (Addendum)
° °   °  [address]       Bonnie [place] [address]             [phone]      2 Days Post-Op Procedure(s) (LRB): XI ROBOTIC ASSISTED THORASCOPY-RIGHT UPPER LOBECTOMY (Right) INTERCOSTAL NERVE BLOCK (Right) NODE DISSECTION (Right)  Subjective:  Patient doing well.  Has pain but its well controlled.  Denies N/V  Objective: Vital signs in last 24 hours: Temp:  [97.9 F (36.6 C)-98.7 F (37.1 C)] 98 F (36.7 C) (02/01 0402) Pulse Rate:  [67-80] 73 (02/01 0745) Cardiac Rhythm: Normal sinus rhythm (02/01 0712) Resp:  [14-20] 20 (02/01 0745) BP: (131-151)/(76-94) 151/93 (02/01 0745) SpO2:  [96 %-99 %] 98 % (02/01 0745)  Intake/Output from previous day: 01/31 0701 - 02/01 0700 In: 480 [P.O.:480] Out: 170 [Chest Tube:170]  General appearance: alert, cooperative, and no distress Heart: regular rate and rhythm Lungs: clear to auscultation bilaterally Abdomen: soft, non-tender; bowel sounds normal; no masses,  no organomegaly Extremities: extremities normal, atraumatic, no cyanosis or edema Wound: clean and dry  Lab Results: Recent Labs    10/31/21 0117 11/01/21 0111  WBC 8.7 6.9  HGB 9.9* 9.7*  HCT 31.1* 29.7*  PLT 264 241   BMET:  Recent Labs    10/31/21 0117 11/01/21 0111  NA 135 139  K 3.6 3.5  CL 104 107  CO2 22 25  GLUCOSE 133* 102*  BUN 9 7  CREATININE 0.77 0.70  CALCIUM 8.5* 8.3*    PT/INR: No results for input(s): LABPROT, INR in the last 72 hours. ABG    Component Value Date/Time   PHART 7.408 10/26/2021 1055   HCO3 22.1 10/26/2021 1055   ACIDBASEDEF 1.9 10/26/2021 1055   O2SAT 98.2 10/26/2021 1055   CBG (last 3)  No results for input(s): GLUCAP in the last 72 hours.  Assessment/Plan: S/P Procedure(s) (LRB): XI ROBOTIC ASSISTED THORASCOPY-RIGHT UPPER LOBECTOMY (Right) INTERCOSTAL NERVE BLOCK (Right) NODE DISSECTION (Right)  CV- NSR, mild HTN monitor Pulm- CT on water seal, tidaling vs. Small air leak... CXR ordered will  review once completed... will discuss CT management with Dr. Kipp Brood Renal- creatinine, lytes okay Lovenox for DVT prophylaxis Dispo- patient stable, await CXR, tidaling vs. Small air leak, will discuss CT managemetn with Dr. Kipp Brood   LOS: 2 days   Agree with with above Will keep CT today Continue pulm hyg  Sorayah Schrodt [place]    Erin Barrett, PA-C 11/01/2021

## 2021-11-02 ENCOUNTER — Other Ambulatory Visit (HOSPITAL_COMMUNITY): Payer: Self-pay

## 2021-11-02 ENCOUNTER — Inpatient Hospital Stay (HOSPITAL_COMMUNITY): Payer: No Typology Code available for payment source

## 2021-11-02 MED ORDER — OXYCODONE HCL 5 MG PO TABS
5.0000 mg | ORAL_TABLET | ORAL | 0 refills | Status: DC | PRN
  Filled 2021-11-02: qty 30, 5d supply, fill #0

## 2021-11-02 NOTE — Progress Notes (Signed)
° °   °  [address]       [place] [address]             [phone]      3 Days Post-Op Procedure(s) (LRB): XI ROBOTIC ASSISTED THORASCOPY-RIGHT UPPER LOBECTOMY (Right) INTERCOSTAL NERVE BLOCK (Right) NODE DISSECTION (Right)  Subjective:  No new complaints.  Hoping to go home today.  Objective: Vital signs in last 24 hours: Temp:  [98.2 F (36.8 C)-98.6 F (37 C)] 98.4 F (36.9 C) (02/02 0353) Pulse Rate:  [72-82] 72 (02/02 0354) Cardiac Rhythm: Normal sinus rhythm (02/02 0705) Resp:  [16-20] 19 (02/02 0354) BP: (136-156)/(78-99) 146/91 (02/02 0354) SpO2:  [94 %-99 %] 99 % (02/02 0354)  Intake/Output from previous day: 02/01 0701 - 02/02 0700 In: 840 [P.O.:840] Out: 220 [Chest Tube:220]  General appearance: alert, cooperative, and no distress Heart: regular rate and rhythm Lungs: clear to auscultation bilaterally Abdomen: soft, non-tender; bowel sounds normal; no masses,  no organomegaly Extremities: extremities normal, atraumatic, no cyanosis or edema Wound: clean and dry  Lab Results: Recent Labs    10/31/21 0117 11/01/21 0111  WBC 8.7 6.9  HGB 9.9* 9.7*  HCT 31.1* 29.7*  PLT 264 241   BMET:  Recent Labs    10/31/21 0117 11/01/21 0111  NA 135 139  K 3.6 3.5  CL 104 107  CO2 22 25  GLUCOSE 133* 102*  BUN 9 7  CREATININE 0.77 0.70  CALCIUM 8.5* 8.3*    PT/INR: No results for input(s): LABPROT, INR in the last 72 hours. ABG    Component Value Date/Time   PHART 7.408 10/26/2021 1055   HCO3 22.1 10/26/2021 1055   ACIDBASEDEF 1.9 10/26/2021 1055   O2SAT 98.2 10/26/2021 1055   CBG (last 3)  No results for input(s): GLUCAP in the last 72 hours.  Assessment/Plan: S/P Procedure(s) (LRB): XI ROBOTIC ASSISTED THORASCOPY-RIGHT UPPER LOBECTOMY (Right) INTERCOSTAL NERVE BLOCK (Right) NODE DISSECTION (Right)  CV- hemodynamically stable, BP stable Pulm- no air leak, CXR with resolution of pneumothorax, will d/c chest tube today if  okay with Dr. Kipp Brood Lovenox for DVT prophylaxis Dispo- patient doing well, no air leak with improvement of tiny apical pneumothorax, will d/c chest tube today if okay with Dr. Kipp Brood and d/c home    LOS: 3 days    Ellwood Handler, PA-C 11/02/2021

## 2021-11-02 NOTE — Plan of Care (Addendum)
°  Problem: Education: Goal: Knowledge of General Education information will improve Description: Including pain rating scale, medication(s)/side effects and non-pharmacologic comfort measures Outcome: Adequate for Discharge   Problem: Health Behavior/Discharge Planning: Goal: Ability to manage health-related needs will improve Outcome: Adequate for Discharge   Problem: Clinical Measurements: Goal: Will remain free from infection Outcome: Adequate for Discharge   Problem: Clinical Measurements: Goal: Diagnostic test results will improve Outcome: Adequate for Discharge   Problem: Coping: Goal: Level of anxiety will decrease Outcome: Adequate for Discharge   Problem: Elimination: Goal: Will not experience complications related to bowel motility Outcome: Adequate for Discharge Goal: Will not experience complications related to urinary retention Outcome: Adequate for Discharge   Problem: Elimination: Goal: Will not experience complications related to bowel motility Outcome: Adequate for Discharge Goal: Will not experience complications related to urinary retention Outcome: Adequate for Discharge   Problem: Elimination: Goal: Will not experience complications related to urinary retention Outcome: Adequate for Discharge   Problem: Pain Managment: Goal: General experience of comfort will improve Outcome: Adequate for Discharge     Patient provided with verbal discharge instructions. Paper copy of discharge provided to patient. RN answered all questions. VSS at discharge. IV removed. Patient belongings sent with patient. Patient dc'd via wheelchair by RN through [employer] entrance to private vehicle.

## 2021-11-09 ENCOUNTER — Other Ambulatory Visit: Payer: Self-pay | Admitting: *Deleted

## 2021-11-09 NOTE — Progress Notes (Signed)
The proposed treatment discussed in conference is for discussion purpose only and is not a binding recommendation. The patient was not been physically examined, or presented with their treatment options. Therefore, final treatment plans cannot be decided.  

## 2021-11-10 ENCOUNTER — Ambulatory Visit (INDEPENDENT_AMBULATORY_CARE_PROVIDER_SITE_OTHER): Payer: Self-pay | Admitting: Thoracic Surgery (Cardiothoracic Vascular Surgery)

## 2021-11-10 ENCOUNTER — Other Ambulatory Visit: Payer: Self-pay

## 2021-11-10 VITALS — BP 140/82 | HR 72 | Resp 20 | Ht 64.0 in | Wt 182.0 lb

## 2021-11-10 DIAGNOSIS — Z09 Encounter for follow-up examination after completed treatment for conditions other than malignant neoplasm: Secondary | ICD-10-CM

## 2021-11-10 DIAGNOSIS — R911 Solitary pulmonary nodule: Secondary | ICD-10-CM

## 2021-11-10 LAB — SURGICAL PATHOLOGY

## 2021-11-10 NOTE — Progress Notes (Signed)
° °   °  [address]       [place] [address]             [place] Record #[id number] Date of Birth: April 15, 1976  Referring: [name], DO Primary Care: [name], MD Primary Cardiologist:None  Reason for visit:   follow-up  History of Present Illness:     Bonnie Harper comes in for her 1 week follow-up appointment.  Overall she is doing well.  She complains of some incisional pain.  She denies any shortness of breath.  Physical Exam: BP 140/82    Pulse 72    Resp 20    Ht 5\' 4"  (1.626 m)    Wt 182 lb (82.6 kg)    SpO2 97% Comment: RA   BMI 31.24 kg/m   Alert NAD Incision clean.   Abdomen soft, ND No peripheral edema   Diagnostic Studies & Laboratory data:  Path: FINAL MICROSCOPIC DIAGNOSIS:   A. LUNG, RIGHT UPPER LOBE, LOBECTOMY:  -  Invasive adenocarcinoma, moderately differentiated, 1.1 cm  -  Carcinoma involves the pleura  -  Margins uninvolved by adenocarcinoma  -  No carcinoma identified in four lymph nodes (0/4)  -  Fibrotic foci (x2; mid-portion and apex)  -  See oncology table and comment below   B. LYMPH NODE, LEVEL 4, EXCISION:  -  No carcinoma identified in nodal tissue   C. LYMPH NODE, HILAR, EXCISION:  -  No carcinoma identified in nodal tissue   D. LYMPH NODE, LEVEL 7, EXCISION:  -  No carcinoma identified in nodal tissue   ONCOLOGY TABLE:   LUNG: Resection   Synchronous Tumors: Not applicable  Total Number of Primary Tumors: 1  Procedure: Lobectomy  Specimen Laterality: Right  Tumor Focality: Unifocal  Tumor Site: Upper lobe of lung  Tumor Size: 1.1 cm  Histologic Type: Invasive adenocarcinoma (acinar and micropapillary)  Visceral Pleura Invasion: Present  Direct Invasion of Adjacent Structures: No adjacent structures present  Lymphovascular Invasion: Not identified  Margins: All margins negative for invasive carcinoma       Closest Margin(s) to Invasive Carcinoma:  Bronchial margin (3.5 cm)  Treatment Effect: No known presurgical therapy  Regional Lymph Nodes:       Number of Lymph Nodes Involved: 0                            Nodal Sites with Tumor: 0       Number of Lymph Nodes Examined: 7                       Nodal Sites Examined: Hilar, level 4, level 7  Distant Metastasis:       Distant Site(s) Involved: Not applicable  Pathologic Stage Classification (pTNM, AJCC 8th Edition): pT2 pN0     Assessment / Plan:   46 year old female status post robotic assisted right upper lobectomy for stage I, T2 N0 M0 adenocarcinoma.  Overall doing well.  She will follow-up in 1 month with a chest x-ray.   Bonnie Harper 11/10/2021 5:00 PM

## 2021-12-13 ENCOUNTER — Other Ambulatory Visit: Payer: Self-pay | Admitting: Thoracic Surgery (Cardiothoracic Vascular Surgery)

## 2021-12-13 DIAGNOSIS — C3490 Malignant neoplasm of unspecified part of unspecified bronchus or lung: Secondary | ICD-10-CM

## 2021-12-15 ENCOUNTER — Other Ambulatory Visit: Payer: Self-pay

## 2021-12-15 ENCOUNTER — Ambulatory Visit (INDEPENDENT_AMBULATORY_CARE_PROVIDER_SITE_OTHER): Payer: Self-pay | Admitting: Thoracic Surgery (Cardiothoracic Vascular Surgery)

## 2021-12-15 ENCOUNTER — Ambulatory Visit
Admission: RE | Admit: 2021-12-15 | Discharge: 2021-12-15 | Disposition: A | Discharge status: Home / Self Care | Payer: No Typology Code available for payment source | Source: Ambulatory Visit | Attending: Thoracic Surgery (Cardiothoracic Vascular Surgery) | Admitting: Thoracic Surgery (Cardiothoracic Vascular Surgery)

## 2021-12-15 VITALS — BP 140/88 | HR 80 | Resp 20 | Ht 64.0 in | Wt 180.0 lb

## 2021-12-15 DIAGNOSIS — C3490 Malignant neoplasm of unspecified part of unspecified bronchus or lung: Secondary | ICD-10-CM

## 2021-12-15 DIAGNOSIS — R911 Solitary pulmonary nodule: Secondary | ICD-10-CM

## 2021-12-15 DIAGNOSIS — Z09 Encounter for follow-up examination after completed treatment for conditions other than malignant neoplasm: Secondary | ICD-10-CM

## 2021-12-15 NOTE — Progress Notes (Signed)
? ?   ?  [address] ?      [place] [address] ?            [phone]       ? ?Bonnie Harper ?[facility] Record #[id number] ?Date of Birth: Mar 21, 1976 ? ?Referring: [name], DO ?Primary Care: [name], MD ?Primary Cardiologist:None ? ?Reason for visit:   follow-up ? ?History of Present Illness:     ?Bonnie Harper presents for 1 month follow-up appointment.  Overall she is doing well.  She denies any shortness of breath or pain in the incisions. ? ?Physical Exam: ?BP 140/88   Pulse 80   Resp 20   Ht 5\' 4"  (1.626 m)   Wt 180 lb (81.6 kg)   SpO2 96%   BMI 30.90 kg/m?  ? ?Alert NAD ?Incision clean.   ?Abdomen, ND ?No peripheral edema ? ? ?Diagnostic Studies & Laboratory data: ?CXR: Clear ? ?  ? ?Assessment / Plan:   ?46 year old female status post right upper lobectomy for stage I T2 N0 M0 adenocarcinoma.  She has recovered well from this.  She also has a history of multiple other cancers including thyroid and breast cancer.  She will follow-up with Dr. Julien Nordmann for ongoing surveillance of her lung cancer.  She has undergone genetic testing in the past.  She will follow-up with [place] as needed. ? ? ?Bonnie Harper ?12/15/2021 10:32 AM ? ? ? ? ? ? ?

## 2021-12-18 ENCOUNTER — Encounter: Payer: Self-pay | Admitting: *Deleted

## 2021-12-18 DIAGNOSIS — C3490 Malignant neoplasm of unspecified part of unspecified bronchus or lung: Secondary | ICD-10-CM

## 2021-12-18 NOTE — Progress Notes (Signed)
Oncology Nurse Navigator Documentation ? ?Oncology Nurse Navigator Flowsheets 12/18/2021  ?Navigator Location [facility]  ?Navigator Encounter Type Telephone  ?Telephone Outgoing Call/I called Ms. Wands and scheduled her to be seen with Dr. Julien Nordmann on 4/4. She verbalized understanding of appt  ?Barriers/Navigation Needs Coordination of Care  ?Interventions Coordination of Care;Education  ?Acuity Level 2-Minimal Needs (1-2 Barriers Identified)  ?Coordination of Care Appts  ?Time Spent with Patient 30  ?  ?

## 2022-01-01 ENCOUNTER — Encounter: Payer: Self-pay | Admitting: Pulmonary Disease

## 2022-01-01 ENCOUNTER — Ambulatory Visit (INDEPENDENT_AMBULATORY_CARE_PROVIDER_SITE_OTHER): Payer: No Typology Code available for payment source | Admitting: Pulmonary Disease

## 2022-01-01 VITALS — BP 134/100 | HR 57 | Temp 97.8°F | Ht 64.0 in | Wt 183.8 lb

## 2022-01-01 DIAGNOSIS — Z853 Personal history of malignant neoplasm of breast: Secondary | ICD-10-CM | POA: Diagnosis not present

## 2022-01-01 DIAGNOSIS — Z85850 Personal history of malignant neoplasm of thyroid: Secondary | ICD-10-CM | POA: Diagnosis not present

## 2022-01-01 DIAGNOSIS — C3491 Malignant neoplasm of unspecified part of right bronchus or lung: Secondary | ICD-10-CM

## 2022-01-01 DIAGNOSIS — Z803 Family history of malignant neoplasm of breast: Secondary | ICD-10-CM

## 2022-01-01 DIAGNOSIS — Z902 Acquired absence of lung [part of]: Secondary | ICD-10-CM

## 2022-01-01 NOTE — Progress Notes (Signed)
? ?Synopsis: Referred in November 2022 for lung nodule by [name], MD ? ?Subjective:  ? ?PATIENT ID: Bonnie Harper GENDER: female DOB: [birth year], MRN: [id number] ? ?Chief Complaint  ?Patient presents with  ? Follow-up  ?  Follow up.   ? ? ? ?This is a 46 year old female, past medical history of breast cancer, right breast status post bilateral mastectomy 2008, history of thyroid cancer status post thyroidectomy in 2011. Patient had a CT scan of the chest on 08/03/2021.  This revealed a right upper lobe 1 cm irregular spiculated nodule.  Also found to have a smaller 4 mm subpleural nodule and an additional left lower lobe 3 mm subpleural nodule.  Due to the patient's history of breast and thyroid cancer there was recommendations for consideration of PET scan versus tissue biopsy.  However back in August 2022 patient did have a PET scan.  There was low-level activity within the dominant right upper lobe pulmonary nodule at that time which recommended a dedicated 49-month CT scan follow-up.  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  She has a small smoking history from age [age] where she smoked just a few cigarettes a day.  In addition to the lung nodule she does have complaints of cough.  They did get a new cat this summer.  Her cough is random but she does state that has been new.  Wondering if the nodule could be related. ? ?OV 08/16/2021 here today to discuss CT scan and considerations for tissue sampling or biopsy as needed. ? ?OV 09/04/2021: Here today to follow-up after bronchoscopy. Right upper lobe nodule biopsy consistent with adenocarcinoma.  Here today to discuss bronchoscopy results.  Husband present as well. ? ?OV 01/01/2022: Here today for follow-up after surgery.  Doing really well after resection of her stage I adenocarcinoma.  Able to get back to her daily living.  From a respiratory standpoint she has no issues.  She does have some soreness still in the chest  but overall doing well. ? ? ?Past Medical History:  ?Diagnosis Date  ? Anxiety   ? Breast cancer ([place]) 02/28/2007  ? Right breast s/p bilat mastectomies  ? Cancer ([place]) 11/03/2009  ? Thyroid cancer s/p thyroidectomy   ? IBS (irritable bowel syndrome)   ? Thyroid disease   ?  ? ?Family History  ?Problem Relation Age of Onset  ? Stroke Father 78  ? Diabetes Father   ? Asthma Father   ? Cancer Maternal Uncle 12  ?     melanoma  ? Cancer Maternal Grandmother 46  ?     bilateral breast cancer  ? Skin cancer Maternal Grandmother   ?     BCC, SCC, and melanoma   ? Cancer Other 79  ?     mat great aunt through [facility] with breast cancer  ? Cancer Other 4  ?     maternal great aunt through [facility] with breast cancer at age 73, ovarian ca at 60 and 2nd breast primary in 45s  ? Lung cancer Paternal Uncle 93  ? Cancer Paternal Grandfather   ?     oral; dx late 65s  ?  ? ?Past Surgical History:  ?Procedure Laterality Date  ? BREAST RECONSTRUCTION Bilateral   ? BRONCHIAL BIOPSY  08/29/2021  ? Procedure: BRONCHIAL BIOPSIES;  Surgeon: [name], DO;  Location: [place] [facility];  Service: Pulmonary;;  ? BRONCHIAL BRUSHINGS  08/29/2021  ? Procedure: BRONCHIAL BRUSHINGS;  Surgeon: June Leap  L, DO;  Location: [place] [facility];  Service: Pulmonary;;  ? BRONCHIAL NEEDLE ASPIRATION BIOPSY  08/29/2021  ? Procedure: BRONCHIAL NEEDLE ASPIRATION BIOPSIES;  Surgeon: [name], DO;  Location: [place] [facility];  Service: Pulmonary;;  ? CESAREAN SECTION    ? C-section x 2  ? CHOLECYSTECTOMY    ? FIDUCIAL MARKER PLACEMENT  08/29/2021  ? Procedure: FIDUCIAL MARKER PLACEMENT;  Surgeon: [name], DO;  Location: [facility];  Service: Pulmonary;;  ? IMPLANT OF A SILASTIC METATARSAL PHALANGE JOINT Left 2021  ? 3 titanium rods in joint  ? INTERCOSTAL NERVE BLOCK Right 10/30/2021  ? Procedure: INTERCOSTAL NERVE BLOCK;  Surgeon: [name], MD;  Location: [place];  Service: Thoracic;  Laterality: Right;  ? MASTECTOMY Bilateral 08/14/2007   ? NODE DISSECTION Right 10/30/2021  ? Procedure: NODE DISSECTION;  Surgeon: [name], MD;  Location: [place];  Service: Thoracic;  Laterality: Right;  ? PORT-A-CATH REMOVAL  08/14/2007  ? THYROIDECTOMY  11/03/2009  ? TONSILLECTOMY    ? removed as a child  ? ? ?Social History  ? ?Socioeconomic History  ? Marital status: Married  ?  Spouse name: Not on file  ? Number of children: 2  ? Years of education: Not on file  ? Highest education level: Not on file  ?Occupational History  ? Not on file  ?Tobacco Use  ? Smoking status: Former  ?  Types: Cigarettes  ?  Quit date: 04/04/2002  ?  Years since quitting: 19.7  ? Smokeless tobacco: Never  ?Vaping Use  ? Vaping Use: Never used  ?Substance and Sexual Activity  ? Alcohol use: Yes  ?  Alcohol/week: 2.0 standard drinks  ?  Types: 2 Glasses of wine per week  ? Drug use: No  ? Sexual activity: Yes  ?  Birth control/protection: Other-see comments  ?  Comment: Husband is s/p vasectomy  ?Other Topics Concern  ? Not on file  ?Social History Narrative  ? Not on file  ? ?Social Determinants of Health  ? ?Financial Resource Strain: Not on file  ?Food Insecurity: Not on file  ?Transportation Needs: Not on file  ?Physical Activity: Not on file  ?Stress: Not on file  ?Social Connections: Not on file  ?Intimate Partner Violence: Not on file  ?  ? ?Allergies  ?Allergen Reactions  ? Zofran Other (See Comments)  ?  migraines  ? Sertraline Other (See Comments)  ?  Migraines  ?  ? ?Outpatient Medications Prior to Visit  ?Medication Sig Dispense Refill  ? acetaminophen (TYLENOL) 325 MG tablet Take 650 mg by mouth every 4 (four) hours as needed for headache, mild pain or moderate pain.    ? cetirizine (ZYRTEC) 10 MG tablet Take 10 mg by mouth daily as needed for allergies.    ? Cholecalciferol (VITAMIN D PO) Take 5,000 Units by mouth every evening.    ? cholestyramine (QUESTRAN) 4 G packet Take 4 g by mouth daily with lunch.    ? Famotidine (PEPCID AC MAXIMUM STRENGTH PO) Take 10 mg by  mouth every evening.    ? levothyroxine (SYNTHROID) 112 MCG tablet Take 112 mcg by mouth daily before breakfast.    ? liothyronine (CYTOMEL) 5 MCG tablet Take 5 mcg by mouth daily.  4  ? LORazepam (ATIVAN) 1 MG tablet Take 1 mg by mouth daily as needed for anxiety.    ? Magnesium 250 MG TABS Take 500 mg by mouth every evening.    ? naproxen  sodium (ALEVE) 220 MG tablet Take 220 mg by mouth every 12 (twelve) hours as needed (Headache and back pain).    ? oxyCODONE (OXY IR/ROXICODONE) 5 MG immediate release tablet Take 1 tablet (5 mg total) by mouth every 4 (four) hours as needed for moderate pain. 30 tablet 0  ? vitamin C (ASCORBIC ACID) 500 MG tablet Take 500 mg by mouth every evening.    ? ?No facility-administered medications prior to visit.  ? ? ?Review of Systems  ?Constitutional:  Negative for chills, fever, malaise/fatigue and weight loss.  ?HENT:  Negative for hearing loss, sore throat and tinnitus.   ?Eyes:  Negative for blurred vision and double vision.  ?Respiratory:  Negative for cough, hemoptysis, sputum production, shortness of breath, wheezing and stridor.   ?Cardiovascular:  Negative for chest pain, palpitations, orthopnea, leg swelling and PND.  ?Gastrointestinal:  Negative for abdominal pain, constipation, diarrhea, heartburn, nausea and vomiting.  ?Genitourinary:  Negative for dysuria, hematuria and urgency.  ?Musculoskeletal:  Positive for myalgias. Negative for joint pain.  ?     Soreness in the right chest  ?Skin:  Negative for itching and rash.  ?Neurological:  Negative for dizziness, tingling, weakness and headaches.  ?Endo/Heme/Allergies:  Negative for environmental allergies. Does not bruise/bleed easily.  ?Psychiatric/Behavioral:  Negative for depression. The patient is not nervous/anxious and does not have insomnia.   ?All other systems reviewed and are negative. ? ? ?Objective:  ?Physical Exam ?Vitals reviewed.  ?Constitutional:   ?   General: She is not in acute distress. ?   Appearance:  She is well-developed.  ?HENT:  ?   Head: Normocephalic and atraumatic.  ?Eyes:  ?   General: No scleral icterus. ?   Conjunctiva/sclera: Conjunctivae normal.  ?   Pupils: Pupils are equal, round, an

## 2022-01-01 NOTE — Patient Instructions (Addendum)
Thank you for visiting Dr. Alizey Noren at [facility]. Today we recommend the following:  Return if symptoms worsen or fail to improve.    Please do your part to reduce the spread of COVID-19.  

## 2022-01-02 ENCOUNTER — Inpatient Hospital Stay: Payer: No Typology Code available for payment source | Attending: Internal Medicine | Admitting: Internal Medicine

## 2022-01-02 ENCOUNTER — Other Ambulatory Visit: Payer: Self-pay

## 2022-01-02 ENCOUNTER — Inpatient Hospital Stay: Payer: No Typology Code available for payment source

## 2022-01-02 ENCOUNTER — Encounter: Payer: Self-pay | Admitting: Internal Medicine

## 2022-01-02 ENCOUNTER — Encounter: Payer: Self-pay | Admitting: *Deleted

## 2022-01-02 VITALS — BP 144/97 | HR 72 | Temp 98.6°F | Resp 18

## 2022-01-02 DIAGNOSIS — C3411 Malignant neoplasm of upper lobe, right bronchus or lung: Secondary | ICD-10-CM | POA: Insufficient documentation

## 2022-01-02 DIAGNOSIS — Z801 Family history of malignant neoplasm of trachea, bronchus and lung: Secondary | ICD-10-CM | POA: Diagnosis not present

## 2022-01-02 DIAGNOSIS — Z803 Family history of malignant neoplasm of breast: Secondary | ICD-10-CM

## 2022-01-02 DIAGNOSIS — Z808 Family history of malignant neoplasm of other organs or systems: Secondary | ICD-10-CM | POA: Diagnosis not present

## 2022-01-02 DIAGNOSIS — C3490 Malignant neoplasm of unspecified part of unspecified bronchus or lung: Secondary | ICD-10-CM

## 2022-01-02 LAB — CBC WITH DIFFERENTIAL (CANCER CENTER ONLY)
Abs Immature Granulocytes: 0.01 10*3/uL (ref 0.00–0.07)
Basophils Absolute: 0.1 10*3/uL (ref 0.0–0.1)
Basophils Relative: 1 %
Eosinophils Absolute: 0.1 10*3/uL (ref 0.0–0.5)
Eosinophils Relative: 3 %
HCT: 35.8 % — ABNORMAL LOW (ref 36.0–46.0)
Hemoglobin: 11.6 g/dL — ABNORMAL LOW (ref 12.0–15.0)
Immature Granulocytes: 0 %
Lymphocytes Relative: 20 %
Lymphs Abs: 1 10*3/uL (ref 0.7–4.0)
MCH: 27.6 pg (ref 26.0–34.0)
MCHC: 32.4 g/dL (ref 30.0–36.0)
MCV: 85.2 fL (ref 80.0–100.0)
Monocytes Absolute: 0.6 10*3/uL (ref 0.1–1.0)
Monocytes Relative: 12 %
Neutro Abs: 3.1 10*3/uL (ref 1.7–7.7)
Neutrophils Relative %: 64 %
Platelet Count: 310 10*3/uL (ref 150–400)
RBC: 4.2 MIL/uL (ref 3.87–5.11)
RDW: 13.7 % (ref 11.5–15.5)
WBC Count: 4.9 10*3/uL (ref 4.0–10.5)
nRBC: 0 % (ref 0.0–0.2)

## 2022-01-02 LAB — CMP (CANCER CENTER ONLY)
ALT: 10 U/L (ref 0–44)
AST: 15 U/L (ref 15–41)
Albumin: 4.2 g/dL (ref 3.5–5.0)
Alkaline Phosphatase: 59 U/L (ref 38–126)
Anion gap: 8 (ref 5–15)
BUN: 13 mg/dL (ref 6–20)
CO2: 25 mmol/L (ref 22–32)
Calcium: 9.6 mg/dL (ref 8.9–10.3)
Chloride: 105 mmol/L (ref 98–111)
Creatinine: 0.69 mg/dL (ref 0.44–1.00)
GFR, Estimated: >60 mL/min (ref >60)
Glucose, Bld: 100 mg/dL — ABNORMAL HIGH (ref 70–99)
Potassium: 4.1 mmol/L (ref 3.5–5.1)
Sodium: 138 mmol/L (ref 135–145)
Total Bilirubin: 0.4 mg/dL (ref 0.3–1.2)
Total Protein: 7.4 g/dL (ref 6.5–8.1)

## 2022-01-02 NOTE — Progress Notes (Signed)
Oncology Nurse Navigator Documentation ? ? ?  01/02/2022  ? 12:00 PM 12/18/2021  ? 10:00 AM  ?Oncology Nurse Navigator Flowsheets  ?Navigator Follow Up Date: 01/08/2022   ?Navigator Follow Up Reason: Molecular Testing   ?Navigator Location [facility]  ?Navigator Encounter Type Pathology Review;Initial MedOnc Telephone  ?Telephone  Outgoing Call  ?Patient Visit Type MedOnc;Initial   ?Barriers/Navigation Needs Coordination of Care/I received a message from Dr. Julien Nordmann to send Bonnie Harper's tissue obtained on 10/30/2021 to [facility] for molecular and PDL 1 testing. I notified pathology to send tissue.  Coordination of Care  ?Interventions Coordination of Care Coordination of Care;Education  ?Acuity Level 2-Minimal Needs (1-2 Barriers Identified) Level 2-Minimal Needs (1-2 Barriers Identified)  ?Coordination of Care Pathology Appts  ?Time Spent with Patient 30 30  ?  ?
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
## 2022-01-05 ENCOUNTER — Encounter: Payer: Self-pay | Admitting: *Deleted

## 2022-01-05 NOTE — Progress Notes (Signed)
Per Dr. Julien Nordmann, he would like a follow up with patient in 6 months.  This appt is set up at this time.   ?

## 2022-01-05 NOTE — Progress Notes (Signed)
Oncology Nurse Navigator Documentation ? ? ?  01/05/2022  ?  9:00 AM 01/02/2022  ? 12:00 PM 12/18/2021  ? 10:00 AM  ?Oncology Nurse Navigator Flowsheets  ?Abnormal Finding Date 05/01/2021    ?Confirmed Diagnosis Date 10/30/2021    ?Diagnosis Status Pending Molecular Studies    ?Planned Course of Treatment Surgery    ?Phase of Treatment Surgery    ?Surgery Actual Start Date: 10/30/2021    ?Navigator Follow Up Date: 01/18/2022 01/08/2022   ?Navigator Follow Up Reason: Awaiting Designer, multimedia   ?Navigator Location [facility]  ?Navigator Encounter Type Molecular Studies Pathology Review;Initial MedOnc Telephone  ?Telephone   Outgoing Call  ?Treatment Initiated Date 10/30/2021    ?Patient Visit Type  MedOnc;Initial   ?Barriers/Navigation Needs Coordination of Care/I followed up on Ms. Grotz's molecular and PDL 1 testing. According to the patient portal for [facility], results are pending with an estimated date of completion on 4/20.  Will follow up on results.  Coordination of Care Coordination of Care  ?Interventions Coordination of Care Coordination of Care Coordination of Care;Education  ?Acuity Level 2-Minimal Needs (1-2 Barriers Identified) Level 2-Minimal Needs (1-2 Barriers Identified) Level 2-Minimal Needs (1-2 Barriers Identified)  ?Coordination of Care Pathology Pathology Appts  ?Time Spent with Patient 30 30 30   ?  ?

## 2022-01-08 ENCOUNTER — Encounter (HOSPITAL_COMMUNITY): Payer: Self-pay | Admitting: Internal Medicine

## 2022-01-09 ENCOUNTER — Encounter: Payer: Self-pay | Admitting: *Deleted

## 2022-01-09 NOTE — Progress Notes (Signed)
Oncology Nurse Navigator Documentation ? ? ?  01/09/2022  ?  3:00 PM 01/05/2022  ?  9:00 AM 01/02/2022  ? 12:00 PM 12/18/2021  ? 10:00 AM  ?Oncology Nurse Navigator Flowsheets  ?Abnormal Finding Date  05/01/2021    ?Confirmed Diagnosis Date  10/30/2021    ?Diagnosis Status  Pending Molecular Studies    ?Planned Course of Treatment  Surgery    ?Phase of Treatment  Surgery    ?Surgery Actual Start Date:  10/30/2021    ?Navigator Follow Up Date:  01/18/2022 01/08/2022   ?Navigator Follow Up Reason:  Awaiting Designer, multimedia   ?Navigator Location [facility]  ?Navigator Encounter Type Molecular Studies Molecular Studies Pathology Review;Initial MedOnc Telephone  ?Telephone    Outgoing Call  ?Treatment Initiated Date  10/30/2021    ?Patient Visit Type Other  MedOnc;Initial   ?Barriers/Navigation Needs Coordination of Care Coordination of Care Coordination of Care Coordination of Care  ?Interventions Coordination of Care/I followed up on Ms. Caponi's molecular and PDL 1 testing a FO.  PDL 1 is completed and is 30%.  Moleculars are pending and according to the FO portal, completion on 4/17.  Will follow up on results.  Coordination of Care Coordination of Care Coordination of Care;Education  ?Acuity Level 2-Minimal Needs (1-2 Barriers Identified) Level 2-Minimal Needs (1-2 Barriers Identified) Level 2-Minimal Needs (1-2 Barriers Identified) Level 2-Minimal Needs (1-2 Barriers Identified)  ?Coordination of Care Pathology Pathology Pathology Appts  ?Time Spent with Patient 30 30 30 30   ?  ?

## 2022-01-11 ENCOUNTER — Encounter (HOSPITAL_COMMUNITY): Payer: Self-pay | Admitting: Internal Medicine

## 2022-01-19 ENCOUNTER — Encounter: Payer: Self-pay | Admitting: *Deleted

## 2022-01-19 NOTE — Progress Notes (Signed)
Oncology Nurse Navigator Documentation ? ? ?  01/19/2022  ? 10:00 AM 01/09/2022  ?  3:00 PM 01/05/2022  ?  9:00 AM 01/02/2022  ? 12:00 PM 12/18/2021  ? 10:00 AM  ?Oncology Nurse Navigator Flowsheets  ?Abnormal Finding Date   05/01/2021    ?Confirmed Diagnosis Date   10/30/2021    ?Diagnosis Status   Pending Molecular Studies    ?Planned Course of Treatment   Surgery    ?Phase of Treatment   Surgery    ?Surgery Actual Start Date:   10/30/2021    ?Navigator Follow Up Date:   01/18/2022 01/08/2022   ?Navigator Follow Up Reason:   Awaiting Designer, multimedia   ?Navigator Location [facility]  ?Navigator Encounter Type Appt/Treatment Plan Review;Molecular Studies Molecular Studies Molecular Studies Pathology Review;Initial MedOnc Telephone  ?Telephone     Outgoing Call  ?Treatment Initiated Date   10/30/2021    ?Patient Visit Type Other Other  MedOnc;Initial   ?Treatment Phase Follow-up      ?Barriers/Navigation Needs Coordination of Care Coordination of Care Coordination of Care Coordination of Care Coordination of Care  ?Interventions Coordination of Care/I followed up on molecular test results and will place on Dr. Worthy Flank desk.   Coordination of Care Coordination of Care Coordination of Care Coordination of Care;Education  ?Acuity Level 2-Minimal Needs (1-2 Barriers Identified) Level 2-Minimal Needs (1-2 Barriers Identified) Level 2-Minimal Needs (1-2 Barriers Identified) Level 2-Minimal Needs (1-2 Barriers Identified) Level 2-Minimal Needs (1-2 Barriers Identified)  ?Coordination of Care Other Pathology Pathology Pathology Appts  ?Time Spent with Patient 45 30 30 30 30   ?  ?

## 2022-02-20 ENCOUNTER — Telehealth: Payer: Self-pay | Admitting: Pulmonary Disease

## 2022-02-20 NOTE — Telephone Encounter (Signed)
Received fax from [facility] on 5/9 requesting medical update for disability.  Sent to Dr. [name] and he has completed the form and I've faxed it to [facility] fax# [phone].  Included bronch and chest x-ray results.

## 2022-03-22 ENCOUNTER — Encounter: Payer: Self-pay | Admitting: *Deleted

## 2022-03-22 NOTE — Progress Notes (Signed)
I followed up on Bonnie Harper's plan of care. She is on observation with follow up schedule with Dr. Julien Nordmann in Oct.

## 2022-06-15 ENCOUNTER — Encounter: Payer: Self-pay | Admitting: Internal Medicine

## 2022-07-02 ENCOUNTER — Inpatient Hospital Stay: Payer: No Typology Code available for payment source | Attending: Internal Medicine

## 2022-07-02 DIAGNOSIS — R918 Other nonspecific abnormal finding of lung field: Secondary | ICD-10-CM | POA: Insufficient documentation

## 2022-07-02 DIAGNOSIS — Z853 Personal history of malignant neoplasm of breast: Secondary | ICD-10-CM | POA: Insufficient documentation

## 2022-07-02 DIAGNOSIS — Z85118 Personal history of other malignant neoplasm of bronchus and lung: Secondary | ICD-10-CM | POA: Insufficient documentation

## 2022-07-02 DIAGNOSIS — Z85850 Personal history of malignant neoplasm of thyroid: Secondary | ICD-10-CM | POA: Diagnosis not present

## 2022-07-02 DIAGNOSIS — C3490 Malignant neoplasm of unspecified part of unspecified bronchus or lung: Secondary | ICD-10-CM

## 2022-07-02 LAB — CMP (CANCER CENTER ONLY)
ALT: 16 U/L (ref 0–44)
AST: 20 U/L (ref 15–41)
Albumin: 4.3 g/dL (ref 3.5–5.0)
Alkaline Phosphatase: 73 U/L (ref 38–126)
Anion gap: 5 (ref 5–15)
BUN: 12 mg/dL (ref 6–20)
CO2: 28 mmol/L (ref 22–32)
Calcium: 9.1 mg/dL (ref 8.9–10.3)
Chloride: 106 mmol/L (ref 98–111)
Creatinine: 0.62 mg/dL (ref 0.44–1.00)
GFR, Estimated: >60 mL/min (ref >60)
Glucose, Bld: 111 mg/dL — ABNORMAL HIGH (ref 70–99)
Potassium: 4.3 mmol/L (ref 3.5–5.1)
Sodium: 139 mmol/L (ref 135–145)
Total Bilirubin: 0.5 mg/dL (ref 0.3–1.2)
Total Protein: 6.9 g/dL (ref 6.5–8.1)

## 2022-07-02 LAB — CBC WITH DIFFERENTIAL (CANCER CENTER ONLY)
Abs Immature Granulocytes: 0.02 10*3/uL (ref 0.00–0.07)
Basophils Absolute: 0.1 10*3/uL (ref 0.0–0.1)
Basophils Relative: 1 %
Eosinophils Absolute: 0.3 10*3/uL (ref 0.0–0.5)
Eosinophils Relative: 7 %
HCT: 37.4 % (ref 36.0–46.0)
Hemoglobin: 12.8 g/dL (ref 12.0–15.0)
Immature Granulocytes: 0 %
Lymphocytes Relative: 19 %
Lymphs Abs: 0.9 10*3/uL (ref 0.7–4.0)
MCH: 30.5 pg (ref 26.0–34.0)
MCHC: 34.2 g/dL (ref 30.0–36.0)
MCV: 89 fL (ref 80.0–100.0)
Monocytes Absolute: 0.6 10*3/uL (ref 0.1–1.0)
Monocytes Relative: 13 %
Neutro Abs: 3 10*3/uL (ref 1.7–7.7)
Neutrophils Relative %: 60 %
Platelet Count: 301 10*3/uL (ref 150–400)
RBC: 4.2 MIL/uL (ref 3.87–5.11)
RDW: 12.2 % (ref 11.5–15.5)
WBC Count: 4.9 10*3/uL (ref 4.0–10.5)
nRBC: 0 % (ref 0.0–0.2)

## 2022-07-04 ENCOUNTER — Inpatient Hospital Stay (HOSPITAL_BASED_OUTPATIENT_CLINIC_OR_DEPARTMENT_OTHER): Payer: No Typology Code available for payment source | Admitting: Internal Medicine

## 2022-07-04 VITALS — BP 136/87 | HR 82 | Temp 97.9°F | Resp 16 | Ht 64.0 in | Wt 173.2 lb

## 2022-07-04 DIAGNOSIS — C3490 Malignant neoplasm of unspecified part of unspecified bronchus or lung: Secondary | ICD-10-CM

## 2022-07-04 DIAGNOSIS — C3411 Malignant neoplasm of upper lobe, right bronchus or lung: Secondary | ICD-10-CM | POA: Diagnosis not present

## 2022-07-04 DIAGNOSIS — R918 Other nonspecific abnormal finding of lung field: Secondary | ICD-10-CM | POA: Diagnosis not present

## 2022-07-04 NOTE — Progress Notes (Signed)
[place] [phone]) [phone]   [phone]  OFFICE PROGRESS NOTE  [name], MD [address] [place] [address]  DIAGNOSIS:  Stage Ib (T2 a, N0, M0) non-small cell lung cancer, adenocarcinoma presented with right upper lobe lung nodule.  The patient also has multiple other small bilateral pulmonary nodules that need close monitoring.   Biomarker Findings Microsatellite status - MS-Stable Tumor Mutational Burden - 0 Muts/Mb Genomic Findings For a complete list of the genes assayed, please refer to the Appendix. RET RET-TRIM33 rearrangement, RET-DCLRE1B noncanonical fusion, TRIM33-RET fusion 7 Disease relevant genes with no reportable alterations: ALK, BRAF, EGFR, ERBB2, KRAS, MET, ROS1  PDL1 Expression 30%  PRIOR THERAPY: status post right upper lobectomy with lymph node sampling on October 30, 2021 under the care of Dr. Kipp Brood.  CURRENT THERAPY: Observation.  INTERVAL HISTORY: Bonnie Harper 46 y.o. female returns to the clinic today for 6 months follow-up visit accompanied by her husband.  The patient is feeling fine today with no concerning complaints except for intermittent pain on the right side of the chest after the surgical scar.  She denied having any current shortness of breath, cough or hemoptysis.  She has no nausea, vomiting, diarrhea or constipation.  She has no headache or visual changes.  She denied having any fever or chills.  She had CT scan of the chest performed at [facility] and she is here today for evaluation and discussion of her scan results.  MEDICAL HISTORY: Past Medical History:  Diagnosis Date   Anxiety    Breast cancer ([facility]) 02/28/2007   Right breast s/p bilat mastectomies   Cancer ([facility]) 11/03/2009   Thyroid cancer s/p thyroidectomy    IBS (irritable bowel syndrome)    Thyroid disease     ALLERGIES:  is allergic to zofran and sertraline.  MEDICATIONS:  Current Outpatient  Medications  Medication Sig Dispense Refill   acetaminophen (TYLENOL) 325 MG tablet Take 650 mg by mouth every 4 (four) hours as needed for headache, mild pain or moderate pain.     cetirizine (ZYRTEC) 10 MG tablet Take 10 mg by mouth daily as needed for allergies.     Cholecalciferol (VITAMIN D PO) Take 5,000 Units by mouth every evening.     cholestyramine (QUESTRAN) 4 G packet Take 4 g by mouth daily with lunch.     Collagenase POWD Take 1 Dose by mouth daily. Puts it in her coffee.     Famotidine (PEPCID AC MAXIMUM STRENGTH PO) Take 10 mg by mouth every evening.     levothyroxine (SYNTHROID) 112 MCG tablet Take 112 mcg by mouth daily before breakfast.     liothyronine (CYTOMEL) 5 MCG tablet Take 5 mcg by mouth daily.  4   LORazepam (ATIVAN) 1 MG tablet Take 1 mg by mouth daily as needed for anxiety.     Magnesium 250 MG TABS Take 500 mg by mouth every evening.     Melatonin 3 MG TBDP Take 1 tablet by mouth at bedtime as needed.     naproxen sodium (ALEVE) 220 MG tablet Take 220 mg by mouth every 12 (twelve) hours as needed (Headache and back pain). (Patient not taking: Reported on 01/02/2022)     oxyCODONE (OXY IR/ROXICODONE) 5 MG immediate release tablet Take 1 tablet (5 mg total) by mouth every 4 (four) hours as needed for moderate pain. 30 tablet 0   vitamin C (ASCORBIC ACID) 500 MG tablet Take 500  mg by mouth every evening.     No current facility-administered medications for this visit.    SURGICAL HISTORY:  Past Surgical History:  Procedure Laterality Date   BREAST RECONSTRUCTION Bilateral    BRONCHIAL BIOPSY  08/29/2021   Procedure: BRONCHIAL BIOPSIES;  Surgeon: [name], DO;  Location: [facility];  Service: Pulmonary;;   BRONCHIAL BRUSHINGS  08/29/2021   Procedure: BRONCHIAL BRUSHINGS;  Surgeon: [name], DO;  Location: [facility];  Service: Pulmonary;;   BRONCHIAL NEEDLE ASPIRATION BIOPSY  08/29/2021   Procedure: BRONCHIAL NEEDLE ASPIRATION BIOPSIES;   Surgeon: [name], DO;  Location: [facility];  Service: Pulmonary;;   CESAREAN SECTION     C-section x 2   CHOLECYSTECTOMY     FIDUCIAL MARKER PLACEMENT  08/29/2021   Procedure: FIDUCIAL MARKER PLACEMENT;  Surgeon: [name], DO;  Location: [facility];  Service: Pulmonary;;   IMPLANT OF A SILASTIC METATARSAL PHALANGE JOINT Left 2021   3 titanium rods in joint   INTERCOSTAL NERVE BLOCK Right 10/30/2021   Procedure: INTERCOSTAL NERVE BLOCK;  Surgeon: [name], MD;  Location: [facility];  Service: Thoracic;  Laterality: Right;   MASTECTOMY Bilateral 08/14/2007   NODE DISSECTION Right 10/30/2021   Procedure: NODE DISSECTION;  Surgeon: [name], MD;  Location: [facility];  Service: Thoracic;  Laterality: Right;   PORT-A-CATH REMOVAL  08/14/2007   THYROIDECTOMY  11/03/2009   TONSILLECTOMY     removed as a child    REVIEW OF SYSTEMS:  A comprehensive review of systems was negative except for: Respiratory: positive for pleurisy/chest pain   PHYSICAL EXAMINATION: General appearance: alert, cooperative, and no distress Head: Normocephalic, without obvious abnormality, atraumatic Neck: no adenopathy, no JVD, supple, symmetrical, trachea midline, and thyroid not enlarged, symmetric, no tenderness/mass/nodules Lymph nodes: Cervical, supraclavicular, and axillary nodes normal. Resp: clear to auscultation bilaterally Back: symmetric, no curvature. ROM normal. No CVA tenderness. Cardio: regular rate and rhythm, S1, S2 normal, no murmur, click, rub or gallop GI: soft, non-tender; bowel sounds normal; no masses,  no organomegaly Extremities: extremities normal, atraumatic, no cyanosis or edema  ECOG PERFORMANCE STATUS: 0 - Asymptomatic  Blood pressure 136/87, pulse 82, temperature 97.9 F (36.6 C), temperature source Oral, resp. rate 16, height 5' 4" (1.626 m), weight 173 lb 3 oz (78.6 kg), SpO2 100 %.  LABORATORY DATA: Lab Results  Component Value Date   WBC 4.9  07/02/2022   HGB 12.8 07/02/2022   HCT 37.4 07/02/2022   MCV 89.0 07/02/2022   PLT 301 07/02/2022      Chemistry      Component Value Date/Time   NA 139 07/02/2022 1005   NA 141 09/06/2016 0949   K 4.3 07/02/2022 1005   K 4.2 09/06/2016 0949   CL 106 07/02/2022 1005   CO2 28 07/02/2022 1005   CO2 25 09/06/2016 0949   BUN 12 07/02/2022 1005   BUN 8.1 09/06/2016 0949   CREATININE 0.62 07/02/2022 1005   CREATININE 0.8 09/06/2016 0949      Component Value Date/Time   CALCIUM 9.1 07/02/2022 1005   CALCIUM 9.5 09/06/2016 0949   ALKPHOS 73 07/02/2022 1005   ALKPHOS 87 09/06/2016 0949   AST 20 07/02/2022 1005   AST 17 09/06/2016 0949   ALT 16 07/02/2022 1005   ALT 19 09/06/2016 0949   BILITOT 0.5 07/02/2022 1005   BILITOT 0.75 09/06/2016 0949       RADIOGRAPHIC STUDIES: CT CHEST W CONTRAST, 06/29/2022 9:25  AM   INDICATION:  _0 C34.90 Malignant neoplasm of unspecified part of unspecified bronchus or lung (HCC) _1 C34.90 Non-small cell lung cancer, unspecified laterality (HCC)  COMPARISON: None   TECHNIQUE: Multislice axial images were obtained through the chest with administration of iodinated intravenous contrast material. Multi-planar reformatted images were generated for additional analysis.   All CT scans at [facility] and [facility] are performed using radiation dose optimization techniques as appropriate to a performed exam, including but not limited to one or more of the following: automatic exposure control, adjustment of the mA and/or kV according to patient size, use of iterative reconstruction technique. In addition, our institution participates in a radiation dose monitoring program to optimize patient radiation exposure.   FINDINGS:   Thoracic inlet/central airways: Thyroidectomy. Airway patent.  Mediastinum/hila/axilla: Status post hilar node dissection. No hilar adenopathy.  Heart/vessels: Borderline four-chamber  cardiomegaly. No pericardial effusion. Aorta normal in caliber and appearance. No central pulmonary embolism.  Lungs/pleura: Postsurgical changes to the right upper lobe with right anterior scarring which extends to the hilum without suspicious nodularity. Multiple sub-6 mm solid pulmonary nodule; for index there is a 4 mm subpleural left lower lobe pulmonary nodule, a 2 mm right middle lobe solid pulmonary nodule, and a left upper lobe micronodule. Additional nodules are labeled in series 3.  Upper abdomen: Cholecystectomy. Focal fatty infiltration adjacent to the falciform ligament. Hepatic steatosis.  Chest wall/MSK: Bilateral mastectomy with subpectoral implants. Remote right rib fracture.No aggressive osseous lesions.    CONCLUSION:   1.  Postsurgical scarring in the right upper lobe without suspicious nodularity to suggest local recurrence.  2.  Bilateral sub-6 mm solid pulmonary nodules without comparison imaging, recommend attention on future follow-up imaging per oncology protocol. Exam End: 06/29/22 09:25   Specimen Collected: 06/29/22 13:06 Last Resulted: 06/29/22 13:34  Received From: [facility]  Result Received: 07/02/22 09:58     ASSESSMENT AND PLAN: This is a very pleasant 46 years old white female with Stage Ib (T2 a, N0, M0) non-small cell lung cancer, adenocarcinoma presented with right upper lobe lung nodule.  The patient also has multiple other small bilateral pulmonary nodules that need close monitoring. She is status post right lower lobectomy with lymph node sampling on October 30, 2021 under the care of Dr. Kipp Brood.  The patient has `RET fusion mutations that could be used for treatment in the future if she develop any disease progression or recurrence Her PD-L1 expression is also 30%. The patient is currently on observation and she is feeling fine with no concerning complaints. She had repeat CT scan of the chest performed at [facility] and that  showed no concerning findings for disease recurrence or metastasis but she continues to have the small pulmonary nodules that need close monitoring. I recommended for the patient to continue on observation with repeat CT scan of the chest in 6 months. She would like her scan to be done at [facility] because she received for the radiology benefit. The patient was advised to call immediately if she has any other concerning symptoms in the interval. The patient voices understanding of current disease status and treatment options and is in agreement with the current care plan.  All questions were answered. The patient knows to call the clinic with any problems, questions or concerns. We can certainly see the patient much sooner if necessary.  The total time spent in the appointment was 20  minutes.  Disclaimer: This note was dictated with voice recognition software. Similar sounding words can inadvertently be transcribed and may not be corrected upon review.

## 2022-10-28 ENCOUNTER — Encounter: Payer: Self-pay | Admitting: Internal Medicine

## 2022-12-11 ENCOUNTER — Encounter: Payer: Self-pay | Admitting: Internal Medicine

## 2022-12-28 ENCOUNTER — Ambulatory Visit (HOSPITAL_BASED_OUTPATIENT_CLINIC_OR_DEPARTMENT_OTHER)
Admission: RE | Admit: 2022-12-28 | Discharge: 2022-12-28 | Disposition: A | Discharge status: Home / Self Care | Payer: 59 | Source: Ambulatory Visit | Attending: Internal Medicine | Admitting: Internal Medicine

## 2022-12-28 ENCOUNTER — Ambulatory Visit (HOSPITAL_COMMUNITY): Admission: RE | Admit: 2022-12-28 | Payer: 59 | Source: Ambulatory Visit

## 2022-12-28 ENCOUNTER — Encounter (HOSPITAL_BASED_OUTPATIENT_CLINIC_OR_DEPARTMENT_OTHER): Payer: Self-pay

## 2022-12-28 DIAGNOSIS — C3490 Malignant neoplasm of unspecified part of unspecified bronchus or lung: Secondary | ICD-10-CM | POA: Diagnosis present

## 2022-12-28 MED ORDER — IOHEXOL 300 MG/ML  SOLN
100.0000 mL | Freq: Once | INTRAMUSCULAR | Status: AC | PRN
  Administered 2022-12-28: 75 mL via INTRAVENOUS

## 2023-01-01 ENCOUNTER — Encounter: Payer: Self-pay | Admitting: Internal Medicine

## 2023-01-01 NOTE — Progress Notes (Signed)
[facility] sent fax requesting pt's ICD-10 code in order for pt's labs to be drawn. Per pts problem list, ICD-10 code C34.90 (Lung Cancer) written on request form and faxed back to [facility] [phone])

## 2023-01-15 ENCOUNTER — Other Ambulatory Visit: Payer: Self-pay | Admitting: Internal Medicine

## 2023-01-15 DIAGNOSIS — Z853 Personal history of malignant neoplasm of breast: Secondary | ICD-10-CM

## 2023-02-13 ENCOUNTER — Ambulatory Visit
Admission: RE | Admit: 2023-02-13 | Discharge: 2023-02-13 | Disposition: A | Discharge status: Home / Self Care | Payer: 59 | Source: Ambulatory Visit | Attending: Internal Medicine | Admitting: Internal Medicine

## 2023-02-13 DIAGNOSIS — Z853 Personal history of malignant neoplasm of breast: Secondary | ICD-10-CM

## 2023-02-13 MED ORDER — GADOPICLENOL 0.5 MMOL/ML IV SOLN
9.0000 mL | Freq: Once | INTRAVENOUS | Status: AC | PRN
  Administered 2023-02-13: 9 mL via INTRAVENOUS

## 2023-07-04 ENCOUNTER — Other Ambulatory Visit: Payer: Self-pay | Admitting: Medical Oncology

## 2023-07-04 ENCOUNTER — Encounter: Payer: Self-pay | Admitting: Medical Oncology

## 2023-07-04 ENCOUNTER — Encounter: Payer: Self-pay | Admitting: Internal Medicine

## 2023-07-04 ENCOUNTER — Other Ambulatory Visit: Payer: Self-pay | Admitting: Internal Medicine

## 2023-07-04 DIAGNOSIS — C3490 Malignant neoplasm of unspecified part of unspecified bronchus or lung: Secondary | ICD-10-CM

## 2023-07-04 NOTE — Progress Notes (Signed)
Faxed lab orders to [facility] on [address]

## 2023-07-08 ENCOUNTER — Telehealth: Payer: Self-pay | Admitting: Physician Assistant

## 2023-07-08 NOTE — Telephone Encounter (Signed)
Scheduled per 10/04 scheduling message, patient has been called and notified.

## 2023-07-10 ENCOUNTER — Encounter: Payer: Self-pay | Admitting: Medical Oncology

## 2023-07-11 ENCOUNTER — Other Ambulatory Visit: Payer: Self-pay | Admitting: Internal Medicine

## 2023-07-12 ENCOUNTER — Ambulatory Visit (HOSPITAL_COMMUNITY)
Admission: RE | Admit: 2023-07-12 | Discharge: 2023-07-12 | Disposition: A | Discharge status: Home / Self Care | Payer: 59 | Source: Ambulatory Visit | Attending: Internal Medicine | Admitting: Internal Medicine

## 2023-07-12 DIAGNOSIS — C3490 Malignant neoplasm of unspecified part of unspecified bronchus or lung: Secondary | ICD-10-CM | POA: Insufficient documentation

## 2023-07-12 LAB — CBC WITH DIFFERENTIAL/PLATELET
Absolute Lymphocytes: 1061 {cells}/uL (ref 850–3900)
Absolute Monocytes: 638 {cells}/uL (ref 200–950)
Basophils Absolute: 51 {cells}/uL (ref 0–200)
Basophils Relative: 1 %
Eosinophils Absolute: 275 {cells}/uL (ref 15–500)
Eosinophils Relative: 5.4 %
HCT: 41.3 % (ref 35.0–45.0)
Hemoglobin: 13.8 g/dL (ref 11.7–15.5)
MCH: 31.8 pg (ref 27.0–33.0)
MCHC: 33.4 g/dL (ref 32.0–36.0)
MCV: 95.2 fL (ref 80.0–100.0)
MPV: 8.8 fL (ref 7.5–12.5)
Monocytes Relative: 12.5 %
Neutro Abs: 3075 {cells}/uL (ref 1500–7800)
Neutrophils Relative %: 60.3 %
Platelets: 295 10*3/uL (ref 140–400)
RBC: 4.34 10*6/uL (ref 3.80–5.10)
RDW: 12 % (ref 11.0–15.0)
Total Lymphocyte: 20.8 %
WBC: 5.1 10*3/uL (ref 3.8–10.8)

## 2023-07-12 LAB — COMPLETE METABOLIC PANEL WITHOUT GFR
AG Ratio: 1.7 (calc) (ref 1.0–2.5)
ALT: 17 U/L (ref 6–29)
AST: 18 U/L (ref 10–35)
Albumin: 4.3 g/dL (ref 3.6–5.1)
Alkaline phosphatase (APISO): 71 U/L (ref 31–125)
BUN: 15 mg/dL (ref 7–25)
CO2: 26 mmol/L (ref 20–32)
Calcium: 9.7 mg/dL (ref 8.6–10.2)
Chloride: 106 mmol/L (ref 98–110)
Creat: 0.66 mg/dL (ref 0.50–0.99)
Globulin: 2.6 g/dL (ref 1.9–3.7)
Glucose, Bld: 95 mg/dL (ref 65–99)
Potassium: 4.8 mmol/L (ref 3.5–5.3)
Sodium: 139 mmol/L (ref 135–146)
Total Bilirubin: 0.4 mg/dL (ref 0.2–1.2)
Total Protein: 6.9 g/dL (ref 6.1–8.1)
eGFR: 109 mL/min/{1.73_m2}

## 2023-07-12 MED ORDER — IOHEXOL 300 MG/ML  SOLN
75.0000 mL | Freq: Once | INTRAMUSCULAR | Status: AC | PRN
  Administered 2023-07-12: 75 mL via INTRAVENOUS

## 2023-07-12 MED ORDER — SODIUM CHLORIDE (PF) 0.9 % IJ SOLN
INTRAMUSCULAR | Status: AC
  Filled 2023-07-12: qty 50

## 2023-07-13 NOTE — Progress Notes (Signed)
[facility] OFFICE PROGRESS NOTE  Bonnie Housekeeper, MD [address] [place] [address]  DIAGNOSIS: Stage Ib (T2 a, N0, M0) non-small cell lung cancer, adenocarcinoma presented with right upper lobe lung nodule.  The patient also has multiple other small bilateral pulmonary nodules that need close monitoring.   She also has a history of breast cancer and thyroid cancer.    Biomarker Findings Microsatellite status - MS-Stable Tumor Mutational Burden - 0 Muts/Mb Genomic Findings For a complete list of the genes assayed, please refer to the Appendix. RET RET-TRIM33 rearrangement, RET-DCLRE1B noncanonical fusion, TRIM33-RET fusion 7 Disease relevant genes with no reportable alterations: ALK, BRAF, EGFR, ERBB2, KRAS, MET, ROS1   PDL1 Expression 30%  PRIOR THERAPY:  status post right upper lobectomy with lymph node sampling on October 30, 2021 under the care of Dr. Cliffton Asters.   CURRENT THERAPY: Observation   INTERVAL HISTORY: Bonnie Harper 47 y.o. female returns to the clinic today for a follow up visit. The patient was last seen by Dr. Arbutus Ped 1 year ago. She is on observation for her history of stage Ib lung cancer. She is status post surgical resection.  She is feeling fine without any concerning complaints.  Sometimes if she talks too much or exercise that she may develop a cough.  She also has chronic headaches now and then.  She was told in the past that if her headaches increase in severity or frequency to let us know.  She states that her headaches are stable.  She has night sweats at baseline on and off since undergoing chemotherapy for breast cancer years ago.  Denies any fever, chills, or weight loss. Denies any chest pain, shortness of breath, or hemoptysis. Denies any nausea, vomiting, diarrhea, or constipation. She recently had a restaging CT scan performed. The patient is here today for evaluation and to review her scan results.    MEDICAL  HISTORY: Past Medical History:  Diagnosis Date   Anxiety    Breast cancer (HCC) 02/28/2007   Right breast s/p bilat mastectomies   Cancer (HCC) 11/03/2009   Thyroid cancer s/p thyroidectomy    IBS (irritable bowel syndrome)    Thyroid disease     ALLERGIES:  is allergic to zofran and sertraline.  MEDICATIONS:  Current Outpatient Medications  Medication Sig Dispense Refill   acetaminophen (TYLENOL) 325 MG tablet Take 650 mg by mouth every 4 (four) hours as needed for headache, mild pain or moderate pain.     cetirizine (ZYRTEC) 10 MG tablet Take 10 mg by mouth daily as needed for allergies.     Cholecalciferol (VITAMIN D PO) Take 5,000 Units by mouth every evening.     Collagenase POWD Take 1 Dose by mouth daily. Puts it in her coffee.     Famotidine (PEPCID AC MAXIMUM STRENGTH PO) Take 10 mg by mouth every evening.     levothyroxine (SYNTHROID) 112 MCG tablet Take 112 mcg by mouth daily before breakfast.     liothyronine (CYTOMEL) 5 MCG tablet Take 5 mcg by mouth daily.  4   LORazepam (ATIVAN) 1 MG tablet Take 1 mg by mouth daily as needed for anxiety.     Magnesium 250 MG TABS Take 500 mg by mouth every evening.     naproxen sodium (ALEVE) 220 MG tablet Take 220 mg by mouth every 12 (twelve) hours as needed (Headache and back pain).     vitamin C (ASCORBIC ACID) 500 MG tablet Take 500 mg  by mouth every evening.     No current facility-administered medications for this visit.    SURGICAL HISTORY:  Past Surgical History:  Procedure Laterality Date   BREAST RECONSTRUCTION Bilateral    BRONCHIAL BIOPSY  08/29/2021   Procedure: BRONCHIAL BIOPSIES;  Surgeon: Josephine Igo, DO;  Location: [facility];  Service: Pulmonary;;   BRONCHIAL BRUSHINGS  08/29/2021   Procedure: BRONCHIAL BRUSHINGS;  Surgeon: Josephine Igo, DO;  Location: [facility];  Service: Pulmonary;;   BRONCHIAL NEEDLE ASPIRATION BIOPSY  08/29/2021   Procedure: BRONCHIAL NEEDLE ASPIRATION BIOPSIES;  Surgeon:  Josephine Igo, DO;  Location: [facility];  Service: Pulmonary;;   CESAREAN SECTION     C-section x 2   CHOLECYSTECTOMY     FIDUCIAL MARKER PLACEMENT  08/29/2021   Procedure: FIDUCIAL MARKER PLACEMENT;  Surgeon: Josephine Igo, DO;  Location: [facility];  Service: Pulmonary;;   IMPLANT OF A SILASTIC METATARSAL PHALANGE JOINT Left 2021   3 titanium rods in joint   INTERCOSTAL NERVE BLOCK Right 10/30/2021   Procedure: INTERCOSTAL NERVE BLOCK;  Surgeon: Corliss Skains, MD;  Location: [facility] OR;  Service: Thoracic;  Laterality: Right;   MASTECTOMY Bilateral 08/14/2007   NODE DISSECTION Right 10/30/2021   Procedure: NODE DISSECTION;  Surgeon: Corliss Skains, MD;  Location: [facility] OR;  Service: Thoracic;  Laterality: Right;   PORT-A-CATH REMOVAL  08/14/2007   THYROIDECTOMY  11/03/2009   TONSILLECTOMY     removed as a child    REVIEW OF SYSTEMS:   Review of Systems  Constitutional: Negative for appetite change, chills, fatigue, fever and unexpected weight change.  HENT: Negative for mouth sores, nosebleeds, sore throat and trouble swallowing.   Eyes: Negative for eye problems and icterus.  Respiratory: Positive for cough if talking too much or with exercise. Negative for hemoptysis, shortness of breath and wheezing.   Cardiovascular: Negative for chest pain and leg swelling.  Gastrointestinal: Negative for abdominal pain, constipation, diarrhea, nausea and vomiting.  Genitourinary: Negative for bladder incontinence, difficulty urinating, dysuria, frequency and hematuria.   Musculoskeletal: Negative for back pain, gait problem, neck pain and neck stiffness.  Skin: Negative for itching and rash.  Neurological: Negative for dizziness, extremity weakness, gait problem, headaches, light-headedness and seizures.  Hematological: Negative for adenopathy. Does not bruise/bleed easily.  Psychiatric/Behavioral: Negative for confusion, depression and sleep disturbance. The patient is not  nervous/anxious.     PHYSICAL EXAMINATION:  Blood pressure 135/87, pulse 67, temperature 97.6 F (36.4 C), temperature source Temporal, resp. rate 16, height 5\' 4"  (1.626 m), weight 182 lb 9.6 oz (82.8 kg), SpO2 99%.  ECOG PERFORMANCE STATUS: 0  Physical Exam  Constitutional: Oriented to person, place, and time and well-developed, well-nourished, and in no distress.   HENT:  Head: Normocephalic and atraumatic.  Mouth/Throat: Oropharynx is clear and moist. No oropharyngeal exudate.  Eyes: Conjunctivae are normal. Right eye exhibits no discharge. Left eye exhibits no discharge. No scleral icterus.  Neck: Normal range of motion. Neck supple.  Cardiovascular: Normal rate, regular rhythm, normal heart sounds and intact distal pulses.   Pulmonary/Chest: Effort normal and breath sounds normal. No respiratory distress. No wheezes. No rales.  Abdominal: Soft. Bowel sounds are normal. Exhibits no distension and no mass. There is no tenderness.  Musculoskeletal: Normal range of motion. Exhibits no edema.  Lymphadenopathy:    No cervical adenopathy.  Neurological: Alert and oriented to person, place, and time. Exhibits normal muscle tone. Gait normal. Coordination normal.  Skin: Skin is warm  and dry. No rash noted. Not diaphoretic. No erythema. No pallor.  Psychiatric: Mood, memory and judgment normal.  Vitals reviewed.  LABORATORY DATA: Lab Results  Component Value Date   WBC 4.9 07/02/2022   HGB 12.8 07/02/2022   HCT 37.4 07/02/2022   MCV 89.0 07/02/2022   PLT 301 07/02/2022      Chemistry      Component Value Date/Time   NA 139 07/02/2022 1005   NA 141 09/06/2016 0949   K 4.3 07/02/2022 1005   K 4.2 09/06/2016 0949   CL 106 07/02/2022 1005   CO2 28 07/02/2022 1005   CO2 25 09/06/2016 0949   BUN 12 07/02/2022 1005   BUN 8.1 09/06/2016 0949   CREATININE 0.62 07/02/2022 1005   CREATININE 0.8 09/06/2016 0949      Component Value Date/Time   CALCIUM 9.1 07/02/2022 1005    CALCIUM 9.5 09/06/2016 0949   ALKPHOS 73 07/02/2022 1005   ALKPHOS 87 09/06/2016 0949   AST 20 07/02/2022 1005   AST 17 09/06/2016 0949   ALT 16 07/02/2022 1005   ALT 19 09/06/2016 0949   BILITOT 0.5 07/02/2022 1005   BILITOT 0.75 09/06/2016 0949       RADIOGRAPHIC STUDIES:  CT Chest W Contrast  Result Date: 07/17/2023 CLINICAL DATA:  Staging non-small-cell lung cancer. Previous right upper lobe resection. History of breast cancer with prior mastectomy and reconstruction * Tracking Code: BO * EXAM: CT CHEST WITH CONTRAST TECHNIQUE: Multidetector CT imaging of the chest was performed during intravenous contrast administration. RADIATION DOSE REDUCTION: This exam was performed according to the departmental dose-optimization program which includes automated exposure control, adjustment of the mA and/or kV according to patient size and/or use of iterative reconstruction technique. CONTRAST:  75mL OMNIPAQUE IOHEXOL 300 MG/ML  SOLN COMPARISON:  CT 12/28/2022 and older FINDINGS: Cardiovascular: Heart is nonenlarged. Stable small pericardial effusion. The thoracic aorta has a normal course and caliber with minimal mild atherosclerotic calcified plaque. Mediastinum/Nodes: Normal caliber thoracic esophagus. Thyroid gland is not well seen. No specific abnormal lymph node enlargement identified in the axillary regions, hilum or mediastinum. Clips in the axillary regions. Bilateral breast implants for reconstruction. Lungs/Pleura: Surgical changes of right-sided lobectomy. There is areas of scarring and fibrotic changes and thickening. No consolidation, pneumothorax or effusion. Juxtapleural 4 mm left lower lobe lung nodule identified, unchanged from previous on series 6, image 112. this has been stable since at least May 2022 demonstrating long-term stability. No additional imaging follow-up. Upper Abdomen: Along the upper abdomen the adrenal glands are preserved. Fatty liver infiltration. Previous  cholecystectomy. Musculoskeletal: No chest wall abnormality. No acute or significant osseous findings. IMPRESSION: Stable surgical changes of right-sided lobectomy. No developing new mass lesion, fluid collection or lymph node enlargement. Fatty liver infiltration. Electronically Signed   By: Karen Kays M.D.   On: 07/17/2023 10:28     ASSESSMENT/PLAN:  This is a very pleasant 47 year old Caucasian female with Stage Ib (T2 a, N0, M0) non-small cell lung cancer, adenocarcinoma presented with right upper lobe lung nodule.  The patient also has multiple other small bilateral pulmonary nodules that need close monitoring. She is status post right lower lobectomy with lymph node sampling on October 30, 2021 under the care of Dr. Cliffton Asters.  The patient has RET fusion mutations that could be used for treatment in the future if she develop any disease progression or recurrence Her PD-L1 expression is also 30%.  The patient is currently on observation and she  is feeling fine with no concerning complaints.   The patient was seen with Dr. Arbutus Ped today.  Dr. Arbutus Ped personally and independently reviewed the scan and discussed results with the patient today.  The scan showed no evidence of disease progression.  Dr. Arbutus Ped recommends she continue on observation with repeat CT in 6 months.   We will see her back in 1 year for evaluation and repeat CT scan.  Her husband works for [employer] and she gets her lab work performed at [employer] a few days prior to her appointment.  The patient was advised to call immediately if she has any concerning symptoms in the interval. The patient voices understanding of current disease status and treatment options and is in agreement with the current care plan. All questions were answered. The patient knows to call the clinic with any problems, questions or concerns. We can certainly see the patient much sooner if necessary    Orders Placed This Encounter  Procedures   CT Chest  W Contrast    Standing Status:   Future    Standing Expiration Date:   07/17/2024    Order Specific Question:   If indicated for the ordered procedure, I authorize the administration of contrast media per Radiology protocol    Answer:   Yes    Order Specific Question:   Does the patient have a contrast media/X-ray dye allergy?    Answer:   No    Order Specific Question:   Is patient pregnant?    Answer:   No    Order Specific Question:   Preferred imaging location?    Answer:   [facility]   CBC with Differential (Cancer Center Only)    Standing Status:   Future    Standing Expiration Date:   07/17/2024   CMP (Cancer Center only)    Standing Status:   Future    Standing Expiration Date:   07/17/2024     Johnette Abraham Borna Wessinger, PA-C 07/18/23  ADDENDUM: Hematology/Oncology Attending: I had a face-to-face encounter with the patient.  I recommended her care plan.  This is a very pleasant 47 years old white female with a stage Ib non-small cell lung cancer, adenocarcinoma presented with right upper lobe pulmonary nodule in addition to other multiple small bilateral pulmonary nodules diagnosed in January 2023 status post right upper lobectomy with lymph node sampling.  The patient has been on observation since that time.  Her molecular studies is positive for RET gene fusion and PDL1 expression of 30%.  She had repeat CT scan of the chest performed recently.  I personally and independently reviewed the scan and discussed the result with the patient today. Her scan showed no concerning findings for disease recurrence or metastasis. I recommended for her to continue on observation with repeat CT scan of the chest in 6 months. She was advised to call immediately if she has any other concerning symptoms in the interval. The total time spent in the appointment was 20 minutes. Disclaimer: This note was dictated with voice recognition software. Similar sounding words can inadvertently be  transcribed and may be missed upon review. [name], MD

## 2023-07-16 ENCOUNTER — Encounter: Payer: Self-pay | Admitting: Medical Oncology

## 2023-07-18 ENCOUNTER — Inpatient Hospital Stay: Payer: 59 | Attending: Physician Assistant | Admitting: Physician Assistant

## 2023-07-18 VITALS — BP 135/87 | HR 67 | Temp 97.6°F | Resp 16 | Ht 64.0 in | Wt 182.6 lb

## 2023-07-18 DIAGNOSIS — Z9013 Acquired absence of bilateral breasts and nipples: Secondary | ICD-10-CM | POA: Insufficient documentation

## 2023-07-18 DIAGNOSIS — Z85850 Personal history of malignant neoplasm of thyroid: Secondary | ICD-10-CM | POA: Diagnosis not present

## 2023-07-18 DIAGNOSIS — Z853 Personal history of malignant neoplasm of breast: Secondary | ICD-10-CM | POA: Diagnosis not present

## 2023-07-18 DIAGNOSIS — Z902 Acquired absence of lung [part of]: Secondary | ICD-10-CM | POA: Diagnosis not present

## 2023-07-18 DIAGNOSIS — C3411 Malignant neoplasm of upper lobe, right bronchus or lung: Secondary | ICD-10-CM | POA: Insufficient documentation

## 2023-07-19 ENCOUNTER — Encounter: Payer: Self-pay | Admitting: Internal Medicine

## 2023-12-23 ENCOUNTER — Encounter: Payer: Self-pay | Admitting: Internal Medicine

## 2023-12-24 ENCOUNTER — Other Ambulatory Visit: Payer: Self-pay | Admitting: Medical Oncology

## 2023-12-24 DIAGNOSIS — C3411 Malignant neoplasm of upper lobe, right bronchus or lung: Secondary | ICD-10-CM

## 2023-12-27 ENCOUNTER — Other Ambulatory Visit: Payer: Self-pay | Admitting: Nurse Practitioner

## 2023-12-27 ENCOUNTER — Other Ambulatory Visit (HOSPITAL_COMMUNITY)
Admission: RE | Admit: 2023-12-27 | Discharge: 2023-12-27 | Disposition: A | Discharge status: Home / Self Care | Source: Ambulatory Visit | Attending: Nurse Practitioner | Admitting: Nurse Practitioner

## 2023-12-27 DIAGNOSIS — Z124 Encounter for screening for malignant neoplasm of cervix: Secondary | ICD-10-CM | POA: Diagnosis present

## 2024-01-01 ENCOUNTER — Telehealth: Payer: Self-pay

## 2024-01-01 ENCOUNTER — Telehealth: Payer: Self-pay | Admitting: Internal Medicine

## 2024-01-01 LAB — CYTOLOGY - PAP
Comment: NEGATIVE
Diagnosis: NEGATIVE
High risk HPV: NEGATIVE

## 2024-01-01 NOTE — Telephone Encounter (Signed)
 TC from pt, stating that she received a VM from scheduling, saying she had a lab appointment scheduled prior to her upcoming visit w/ Dr. Arbutus Ped on 5/1 which needed to be cancelled. She states she just had labs drawn yesterday and that [facility] is supposed to be faxing the results to Dr. Asa Lente office. Advised it would be a good idea to call office to make sure the labs were received prior to her visit, and she verbalizes understanding. Gave pt Dr. Asa Lente office fax number and cancelled her lab appt per pt request.

## 2024-01-01 NOTE — Telephone Encounter (Signed)
 Rescheduled/Scheduled appointment per provider on Pal and pt needing labs. Left the patient a voicemail with the appointment details and changes. The patient will be mailed an appointment reminder.

## 2024-01-02 ENCOUNTER — Encounter: Payer: Self-pay | Admitting: Physician Assistant

## 2024-01-17 ENCOUNTER — Encounter (HOSPITAL_COMMUNITY): Payer: Self-pay

## 2024-01-17 ENCOUNTER — Ambulatory Visit (HOSPITAL_COMMUNITY)
Admission: RE | Admit: 2024-01-17 | Discharge: 2024-01-17 | Disposition: A | Discharge status: Home / Self Care | Source: Ambulatory Visit | Attending: Physician Assistant | Admitting: Physician Assistant

## 2024-01-17 ENCOUNTER — Other Ambulatory Visit

## 2024-01-17 DIAGNOSIS — C3411 Malignant neoplasm of upper lobe, right bronchus or lung: Secondary | ICD-10-CM | POA: Diagnosis present

## 2024-01-17 MED ORDER — IOHEXOL 300 MG/ML  SOLN
75.0000 mL | Freq: Once | INTRAMUSCULAR | Status: AC | PRN
  Administered 2024-01-17: 75 mL via INTRAVENOUS

## 2024-01-17 MED ORDER — SODIUM CHLORIDE (PF) 0.9 % IJ SOLN
INTRAMUSCULAR | Status: AC
  Filled 2024-01-17: qty 50

## 2024-01-23 ENCOUNTER — Ambulatory Visit: Payer: 59 | Admitting: Internal Medicine

## 2024-01-30 ENCOUNTER — Inpatient Hospital Stay: Attending: Internal Medicine | Admitting: Internal Medicine

## 2024-01-30 VITALS — BP 121/85 | HR 75 | Temp 97.8°F | Resp 16 | Ht 64.0 in | Wt 172.7 lb

## 2024-01-30 DIAGNOSIS — Z85850 Personal history of malignant neoplasm of thyroid: Secondary | ICD-10-CM | POA: Insufficient documentation

## 2024-01-30 DIAGNOSIS — Z902 Acquired absence of lung [part of]: Secondary | ICD-10-CM | POA: Diagnosis not present

## 2024-01-30 DIAGNOSIS — Z853 Personal history of malignant neoplasm of breast: Secondary | ICD-10-CM | POA: Diagnosis not present

## 2024-01-30 DIAGNOSIS — C3490 Malignant neoplasm of unspecified part of unspecified bronchus or lung: Secondary | ICD-10-CM

## 2024-01-30 DIAGNOSIS — R42 Dizziness and giddiness: Secondary | ICD-10-CM | POA: Insufficient documentation

## 2024-01-30 DIAGNOSIS — Z85118 Personal history of other malignant neoplasm of bronchus and lung: Secondary | ICD-10-CM | POA: Diagnosis present

## 2024-01-30 NOTE — Progress Notes (Signed)
 [facility] [phone]   [phone]  OFFICE PROGRESS NOTE  Bonnie Mina, MD [address] [place] [address]  DIAGNOSIS:  Stage Ib (T2 a, N0, M0) non-small cell lung cancer, adenocarcinoma presented with right upper lobe lung nodule.  The patient also has multiple other small bilateral pulmonary nodules that need close monitoring.   Biomarker Findings Microsatellite status - MS-Stable Tumor Mutational Burden - 0 Muts/Mb Genomic Findings For a complete list of the genes assayed, please refer to the Appendix. RET RET-TRIM33 rearrangement, RET-DCLRE1B noncanonical fusion, TRIM33-RET fusion 7 Disease relevant genes with no reportable alterations: ALK, BRAF, EGFR, ERBB2, KRAS, MET, ROS1  PDL1 Expression 30%  PRIOR THERAPY: status post right upper lobectomy with lymph node sampling on October 30, 2021 under the care of Dr. Deloise Harper.  CURRENT THERAPY: Observation.  INTERVAL HISTORY: Bonnie Harper 48 y.o. female returns to the clinic today for 6 months follow-up visit.Discussed the use of AI scribe software for clinical note transcription with the patient, who gave verbal consent to proceed.  History of Present Illness   Bonnie Harper is a 48 year old female with stage IB non-small cell lung cancer who presents for evaluation and repeat CT scan of the chest for re-staging.  She has a history of stage IB non-small cell lung cancer, adenocarcinoma with RET rearrangement and PD-L1 expression of 30%. She underwent a right upper lobectomy with lymph node dissection in January 2023 and has been on observation since then. A recent CT scan of the chest shows no evidence of cancer recurrence or metastasis.  She experiences vertigo symptoms at night, particularly when changing positions in bed, for the past four weeks. There are no associated ear infections or pain. No ear pain or known inner ear issues. Her blood pressure is  reported as perfect.  She is no longer working, as her husband suggested she focus on enjoying life after battling cancer three times.       MEDICAL HISTORY: Past Medical History:  Diagnosis Date   Anxiety    Breast cancer (HCC) 02/28/2007   Right breast s/p bilat mastectomies   Cancer (HCC) 11/03/2009   Thyroid  cancer s/p thyroidectomy    IBS (irritable bowel syndrome)    Thyroid  disease     ALLERGIES:  is allergic to zofran and sertraline.  MEDICATIONS:  Current Outpatient Medications  Medication Sig Dispense Refill   acetaminophen  (TYLENOL ) 325 MG tablet Take 650 mg by mouth every 4 (four) hours as needed for headache, mild pain or moderate pain.     cetirizine (ZYRTEC) 10 MG tablet Take 10 mg by mouth daily as needed for allergies.     Cholecalciferol (VITAMIN D  PO) Take 5,000 Units by mouth every evening.     Collagenase POWD Take 1 Dose by mouth daily. Puts it in her coffee.     Famotidine  (PEPCID  AC MAXIMUM STRENGTH PO) Take 10 mg by mouth every evening.     levothyroxine  (SYNTHROID ) 112 MCG tablet Take 112 mcg by mouth daily before breakfast.     liothyronine  (CYTOMEL ) 5 MCG tablet Take 5 mcg by mouth daily.  4   LORazepam  (ATIVAN ) 1 MG tablet Take 1 mg by mouth daily as needed for anxiety.     Magnesium 250 MG TABS Take 500 mg by mouth every evening.     naproxen sodium (ALEVE) 220 MG tablet Take 220 mg by mouth every 12 (twelve) hours as needed (Headache and  back pain).     vitamin C (ASCORBIC ACID) 500 MG tablet Take 500 mg by mouth every evening.     No current facility-administered medications for this visit.    SURGICAL HISTORY:  Past Surgical History:  Procedure Laterality Date   BREAST RECONSTRUCTION Bilateral    BRONCHIAL BIOPSY  08/29/2021   Procedure: BRONCHIAL BIOPSIES;  Surgeon: Bonnie Brownie, DO;  Location: [facility];  Service: Pulmonary;;   BRONCHIAL BRUSHINGS  08/29/2021   Procedure: BRONCHIAL BRUSHINGS;  Surgeon: Bonnie Brownie, DO;   Location: [facility];  Service: Pulmonary;;   BRONCHIAL NEEDLE ASPIRATION BIOPSY  08/29/2021   Procedure: BRONCHIAL NEEDLE ASPIRATION BIOPSIES;  Surgeon: Bonnie Brownie, DO;  Location: [facility];  Service: Pulmonary;;   CESAREAN SECTION     C-section x 2   CHOLECYSTECTOMY     FIDUCIAL MARKER PLACEMENT  08/29/2021   Procedure: FIDUCIAL MARKER PLACEMENT;  Surgeon: Bonnie Brownie, DO;  Location: [facility];  Service: Pulmonary;;   IMPLANT OF A SILASTIC METATARSAL PHALANGE JOINT Left 2021   3 titanium rods in joint   INTERCOSTAL NERVE BLOCK Right 10/30/2021   Procedure: INTERCOSTAL NERVE BLOCK;  Surgeon: Bonnie Lovely, MD;  Location: [facility];  Service: Thoracic;  Laterality: Right;   MASTECTOMY Bilateral 08/14/2007   NODE DISSECTION Right 10/30/2021   Procedure: NODE DISSECTION;  Surgeon: Bonnie Lovely, MD;  Location: [facility];  Service: Thoracic;  Laterality: Right;   PORT-A-CATH REMOVAL  08/14/2007   THYROIDECTOMY  11/03/2009   TONSILLECTOMY     removed as a child    REVIEW OF SYSTEMS:  A comprehensive review of systems was negative except for: Neurological: positive for vertigo   PHYSICAL EXAMINATION: General appearance: alert, cooperative, and no distress Head: Normocephalic, without obvious abnormality, atraumatic Neck: no adenopathy, no JVD, supple, symmetrical, trachea midline, and thyroid  not enlarged, symmetric, no tenderness/mass/nodules Lymph nodes: Cervical, supraclavicular, and axillary nodes normal. Resp: clear to auscultation bilaterally Back: symmetric, no curvature. ROM normal. No CVA tenderness. Cardio: regular rate and rhythm, S1, S2 normal, no murmur, click, rub or gallop GI: soft, non-tender; bowel sounds normal; no masses,  no organomegaly Extremities: extremities normal, atraumatic, no cyanosis or edema  ECOG PERFORMANCE STATUS: 0 - Asymptomatic  Blood pressure 121/85, pulse 75, temperature 97.8 F (36.6 C), temperature source Temporal, resp.  rate 16, height 5\' 4"  (1.626 m), weight 172 lb 11.2 oz (78.3 kg), SpO2 99%.  LABORATORY DATA: Lab Results  Component Value Date   WBC 5.1 07/11/2023   HGB 13.8 07/11/2023   HCT 41.3 07/11/2023   MCV 95.2 07/11/2023   PLT 295 07/11/2023      Chemistry      Component Value Date/Time   NA 139 07/11/2023 0827   NA 141 09/06/2016 0949   K 4.8 07/11/2023 0827   K 4.2 09/06/2016 0949   CL 106 07/11/2023 0827   CO2 26 07/11/2023 0827   CO2 25 09/06/2016 0949   BUN 15 07/11/2023 0827   BUN 8.1 09/06/2016 0949   CREATININE 0.66 07/11/2023 0827   CREATININE 0.8 09/06/2016 0949      Component Value Date/Time   CALCIUM 9.7 07/11/2023 0827   CALCIUM 9.5 09/06/2016 0949   ALKPHOS 73 07/02/2022 1005   ALKPHOS 87 09/06/2016 0949   AST 18 07/11/2023 0827   AST 20 07/02/2022 1005   AST 17 09/06/2016 0949   ALT 17 07/11/2023 0827   ALT 16 07/02/2022 1005   ALT 19 09/06/2016 0949  BILITOT 0.4 07/11/2023 0827   BILITOT 0.5 07/02/2022 1005   BILITOT 0.75 09/06/2016 0949       RADIOGRAPHIC STUDIES: CT Chest W Contrast Result Date: 01/29/2024 CLINICAL DATA:  Restaging right lung cancer. History of breast cancer and thyroid  cancer. * Tracking Code: BO * EXAM: CT CHEST WITH CONTRAST TECHNIQUE: Multidetector CT imaging of the chest was performed during intravenous contrast administration. RADIATION DOSE REDUCTION: This exam was performed according to the departmental dose-optimization program which includes automated exposure control, adjustment of the mA and/or kV according to patient size and/or use of iterative reconstruction technique. CONTRAST:  75mL OMNIPAQUE  IOHEXOL  300 MG/ML  SOLN COMPARISON:  Chest CT 07/12/2023 FINDINGS: Cardiovascular: The heart is normal in size. No pericardial effusion. The aorta is normal in caliber. No dissection or atherosclerotic calcification. Mediastinum/Nodes: Stable residual thymic tissue in the anterior mediastinum. No mediastinal or hilar lymphadenopathy.  The esophagus is grossly normal. Lungs/Pleura: Stable surgical changes from prior right upper lobe lobectomy. No findings suspicious for recurrent tumor. No new pulmonary lesions or pulmonary nodules. No pleural effusion or pleural nodules. The central tracheobronchial tree is unremarkable. Upper Abdomen: No significant upper abdominal findings. No hepatic or adrenal gland lesions. No upper abdominal adenopathy. Musculoskeletal: Bilateral breast prostheses. No breast masses, supraclavicular or axillary adenopathy. The bony thorax is intact. No lytic or sclerotic bone lesions. IMPRESSION: 1. Stable surgical changes from prior right upper lobe lobectomy. No findings suspicious for recurrent tumor. 2. No findings for metastatic disease involving the chest or upper abdomen. Electronically Signed   By: Marrian Siva M.D.   On: 01/29/2024 17:58       ASSESSMENT AND PLAN: This is a very pleasant 48 years old white female with Stage Ib (T2 a, N0, M0) non-small cell lung cancer, adenocarcinoma presented with right upper lobe lung nodule.  The patient also has multiple other small bilateral pulmonary nodules that need close monitoring. She is status post right lower lobectomy with lymph node sampling on October 30, 2021 under the care of Dr. Deloise Harper.  The patient has `RET fusion mutations that could be used for treatment in the future if she develop any disease progression or recurrence Her PD-L1 expression is also 30%. The patient is currently on observation and she is feeling fine except for occasional vertigo episodes.  Non-small cell lung cancer Stage 1B adenocarcinoma with RET rearrangement and PD-L1 expression of 30%. Status post right upper lobectomy with lymph node dissection in January 2023. Currently asymptomatic with no evidence of recurrence or metastasis on recent chest CT. Discussed RET fusion mutation and treatment options if recurrence occurs. - Continue annual surveillance with chest CT -  Provide copy of recent scan results - Advise to report any symptoms or concerns  Vertigo Intermittent vertigo for four weeks, triggered by positional changes in bed. No ear pain or infections. Blood pressure normal. Differential includes potential inner ear issues. - Consult ENT specialist if symptoms persist to evaluate for inner ear issues   The patient was advised to call immediately if she has any concerning symptoms in the interval. The patient voices understanding of current disease status and treatment options and is in agreement with the current care plan.  All questions were answered. The patient knows to call the clinic with any problems, questions or concerns. We can certainly see the patient much sooner if necessary.  The total time spent in the appointment was 20 minutes.  Disclaimer: This note was dictated with voice recognition software. Similar sounding words can  inadvertently be transcribed and may not be corrected upon review.

## 2025-01-26 ENCOUNTER — Other Ambulatory Visit

## 2025-02-02 ENCOUNTER — Ambulatory Visit: Admitting: Internal Medicine
